# Patient Record
Sex: Female | Born: 1945 | Race: White | Hispanic: No | Marital: Married | Smoking: Never smoker
Health system: Southern US, Community
[De-identification: ages and names within clinical notes are randomized; demographics above are authoritative.]

## PROBLEM LIST (undated history)

## (undated) DIAGNOSIS — E785 Hyperlipidemia, unspecified: Secondary | ICD-10-CM

## (undated) DIAGNOSIS — Z972 Presence of dental prosthetic device (complete) (partial): Secondary | ICD-10-CM

## (undated) DIAGNOSIS — K219 Gastro-esophageal reflux disease without esophagitis: Secondary | ICD-10-CM

## (undated) DIAGNOSIS — G4730 Sleep apnea, unspecified: Secondary | ICD-10-CM

## (undated) DIAGNOSIS — H3530 Unspecified macular degeneration: Secondary | ICD-10-CM

## (undated) DIAGNOSIS — J302 Other seasonal allergic rhinitis: Secondary | ICD-10-CM

## (undated) DIAGNOSIS — M810 Age-related osteoporosis without current pathological fracture: Secondary | ICD-10-CM

## (undated) DIAGNOSIS — M62838 Other muscle spasm: Secondary | ICD-10-CM

## (undated) DIAGNOSIS — I10 Essential (primary) hypertension: Secondary | ICD-10-CM

## (undated) DIAGNOSIS — E119 Type 2 diabetes mellitus without complications: Secondary | ICD-10-CM

## (undated) HISTORY — PX: UPPER GASTROINTESTINAL ENDOSCOPY: SHX188

## (undated) HISTORY — PX: COLONOSCOPY: SHX5424

## (undated) HISTORY — PX: BREAST CYST ASPIRATION: SHX578

---

## 2004-12-24 ENCOUNTER — Ambulatory Visit: Payer: Self-pay | Admitting: Internal Medicine

## 2005-12-28 ENCOUNTER — Ambulatory Visit: Payer: Self-pay | Admitting: Internal Medicine

## 2007-01-10 ENCOUNTER — Ambulatory Visit: Payer: Self-pay | Admitting: Internal Medicine

## 2007-11-13 ENCOUNTER — Ambulatory Visit: Payer: Self-pay | Admitting: Internal Medicine

## 2008-02-07 ENCOUNTER — Ambulatory Visit: Payer: Self-pay | Admitting: Internal Medicine

## 2008-04-09 ENCOUNTER — Ambulatory Visit: Payer: Self-pay | Admitting: Internal Medicine

## 2008-11-13 IMAGING — US ABDOMEN ULTRASOUND
1 series · 17 of 25 positions shown · non-contrast
Comparison: none

REASON FOR EXAM: abdominal pain and diabetic
COMMENTS:

[Series 1: abdomen ultrasound · 17 of 58 slices shown]
[im 1/58]
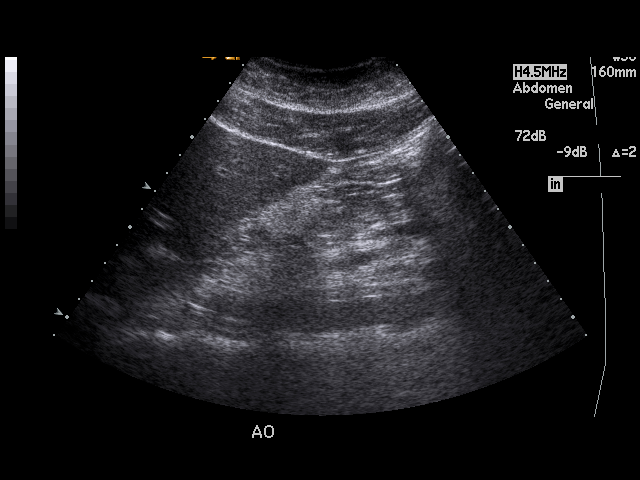
[im 5/58]
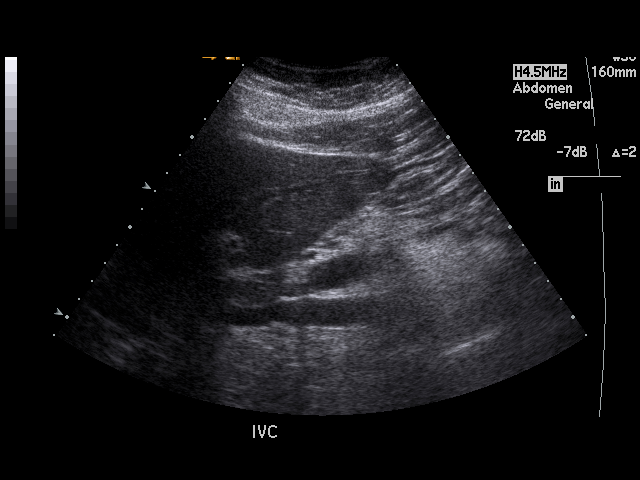
[im 8/58]
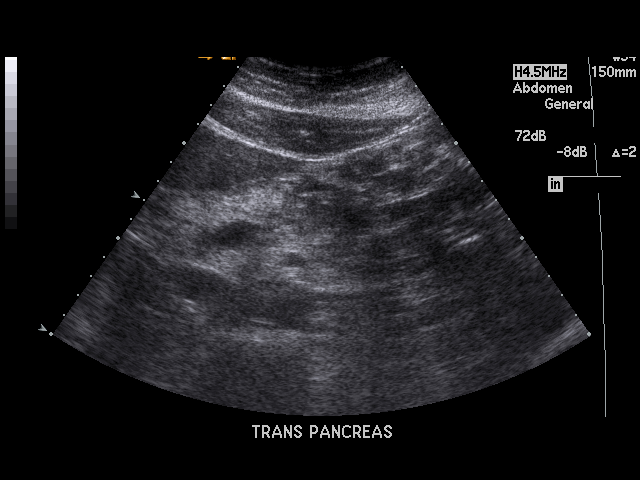
[im 12/58]
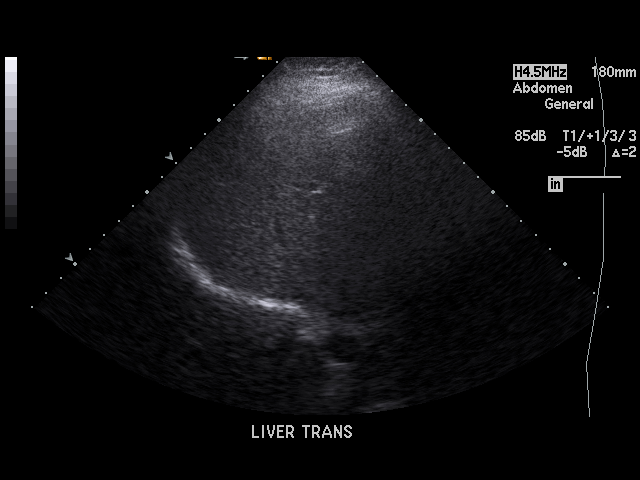
[im 15/58]
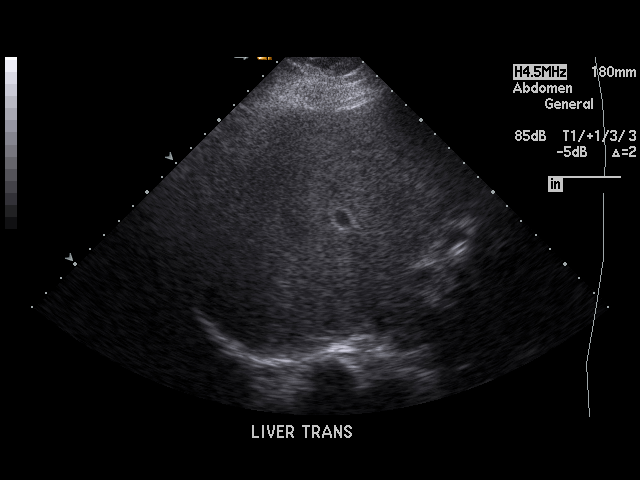
[im 20/58]
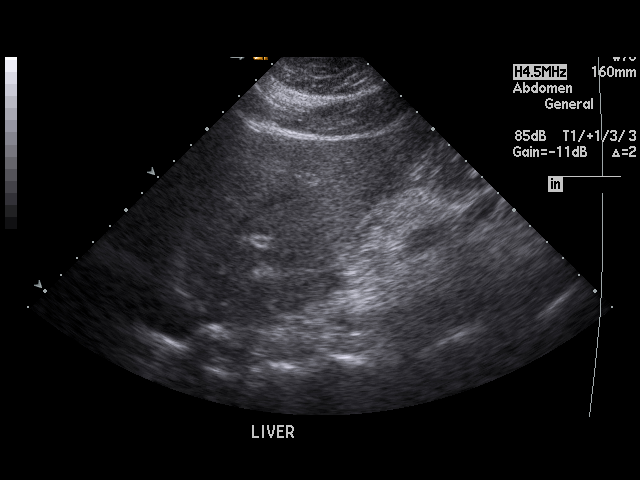
[im 22/58]
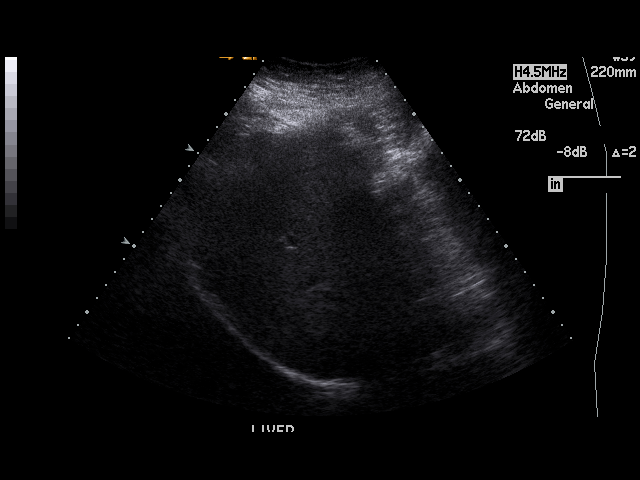
[im 27/58]
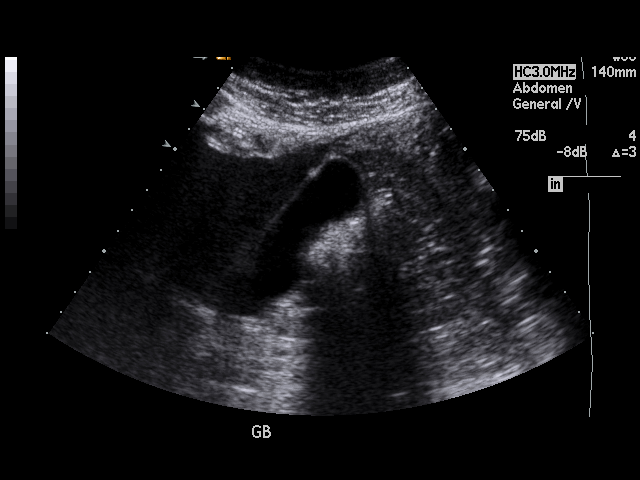
[im 29/58]
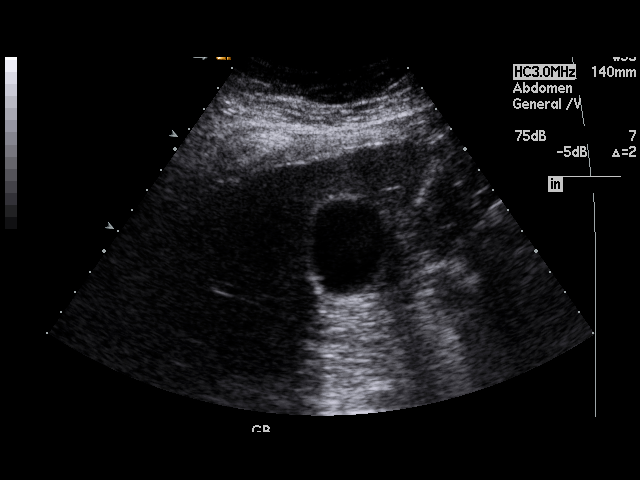
[im 31/58]
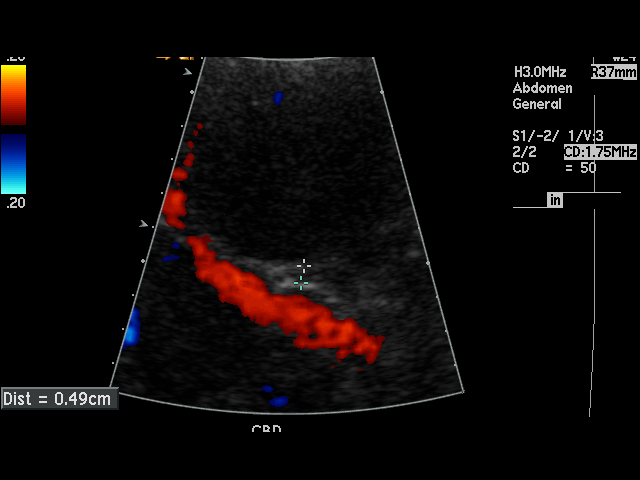
[im 36/58]
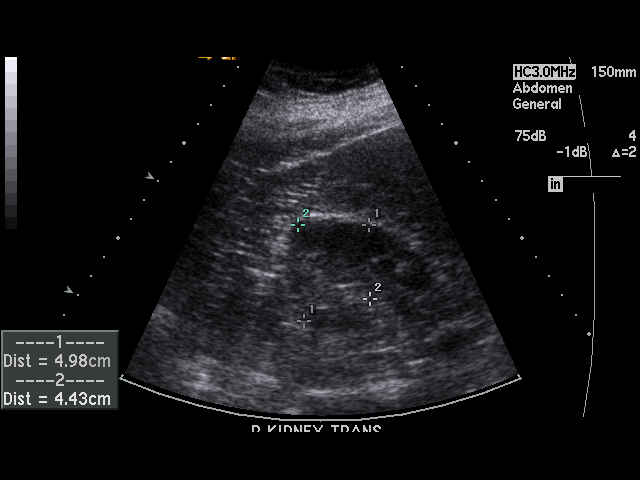
[im 39/58]
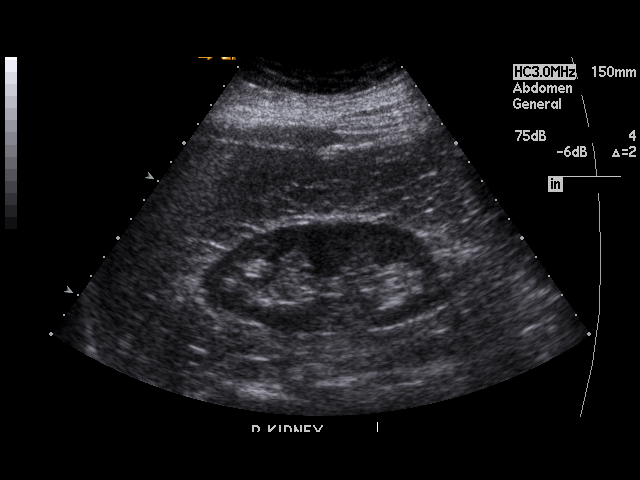
[im 43/58]
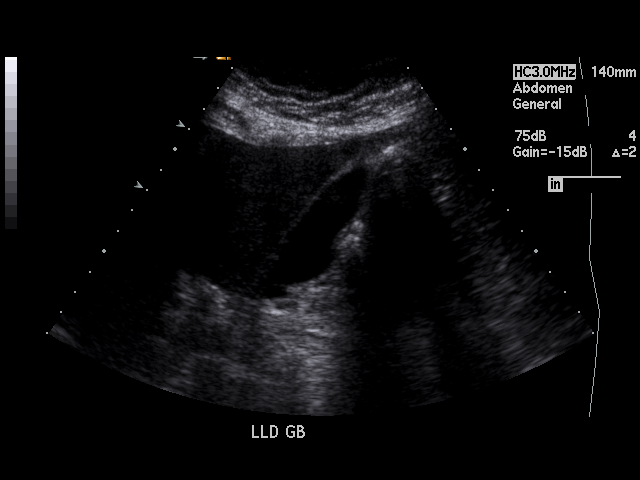
[im 46/58]
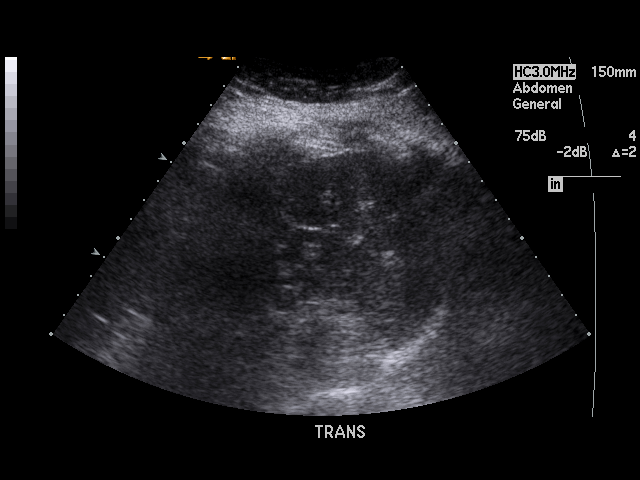
[im 50/58]
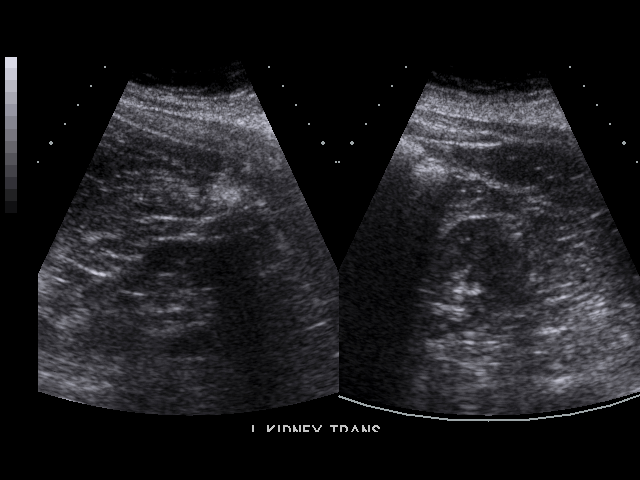
[im 53/58]
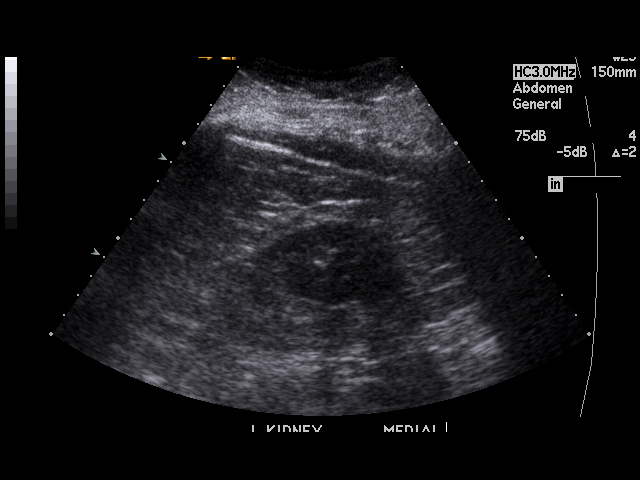
[im 58/58]
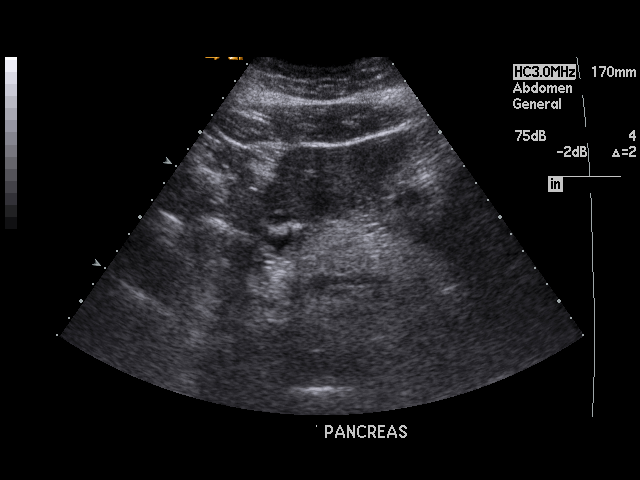

[17 of 25 positions shown; findings below may reference images not displayed]

PROCEDURE:     US  - US ABDOMEN GENERAL SURVEY  - April 09, 2008  [DATE]

RESULT:     The liver is dense, suspicious for fatty infiltration. The
spleen, pancreas, abdominal aorta and inferior vena cava show no significant
abnormalities. No gallstones are seen. There is no thickening of the
gallbladder wall. The common bile duct measures 4.9 mm in diameter which is
within normal limits. The kidneys show no hydronephrosis. There is no
ascites.
IMPRESSION: 1. Probable fatty infiltration of the liver.
2. No gallstones are seen.
3. The pancreas is normal in appearance.
4. There is no ascites.

## 2009-02-11 ENCOUNTER — Ambulatory Visit: Payer: Self-pay | Admitting: Internal Medicine

## 2009-02-19 ENCOUNTER — Ambulatory Visit: Payer: Self-pay | Admitting: Internal Medicine

## 2009-07-29 ENCOUNTER — Ambulatory Visit: Payer: Self-pay | Admitting: Internal Medicine

## 2010-02-12 ENCOUNTER — Ambulatory Visit: Payer: Self-pay | Admitting: Internal Medicine

## 2011-02-25 ENCOUNTER — Ambulatory Visit: Payer: Self-pay | Admitting: Internal Medicine

## 2012-03-20 ENCOUNTER — Ambulatory Visit: Payer: Self-pay | Admitting: Internal Medicine

## 2012-04-03 ENCOUNTER — Ambulatory Visit: Payer: Self-pay | Admitting: Gastroenterology

## 2013-03-21 ENCOUNTER — Ambulatory Visit: Payer: Self-pay

## 2013-03-26 ENCOUNTER — Ambulatory Visit: Payer: Self-pay

## 2014-03-22 ENCOUNTER — Ambulatory Visit: Payer: Self-pay | Admitting: Internal Medicine

## 2014-04-03 ENCOUNTER — Ambulatory Visit: Payer: Self-pay | Admitting: Internal Medicine

## 2015-04-24 ENCOUNTER — Ambulatory Visit
Admit: 2015-04-24 | Disposition: A | Discharge status: Home / Self Care | Payer: Self-pay | Attending: Infectious Diseases | Admitting: Infectious Diseases

## 2016-04-19 ENCOUNTER — Other Ambulatory Visit: Payer: Self-pay | Admitting: Internal Medicine

## 2016-04-19 DIAGNOSIS — Z1231 Encounter for screening mammogram for malignant neoplasm of breast: Secondary | ICD-10-CM

## 2016-04-29 ENCOUNTER — Ambulatory Visit
Admission: RE | Admit: 2016-04-29 | Discharge: 2016-04-29 | Disposition: A | Discharge status: Home / Self Care | Payer: Medicare Other | Source: Ambulatory Visit | Attending: Internal Medicine | Admitting: Internal Medicine

## 2016-04-29 ENCOUNTER — Other Ambulatory Visit: Payer: Self-pay | Admitting: Internal Medicine

## 2016-04-29 DIAGNOSIS — Z1231 Encounter for screening mammogram for malignant neoplasm of breast: Secondary | ICD-10-CM

## 2017-04-25 ENCOUNTER — Other Ambulatory Visit: Payer: Self-pay | Admitting: Internal Medicine

## 2017-04-25 DIAGNOSIS — Z1239 Encounter for other screening for malignant neoplasm of breast: Secondary | ICD-10-CM

## 2017-05-18 ENCOUNTER — Ambulatory Visit
Admission: RE | Admit: 2017-05-18 | Discharge: 2017-05-18 | Disposition: A | Discharge status: Home / Self Care | Payer: Medicare Other | Source: Ambulatory Visit | Attending: Internal Medicine | Admitting: Internal Medicine

## 2017-05-18 DIAGNOSIS — Z1231 Encounter for screening mammogram for malignant neoplasm of breast: Secondary | ICD-10-CM | POA: Insufficient documentation

## 2017-05-18 DIAGNOSIS — Z1239 Encounter for other screening for malignant neoplasm of breast: Secondary | ICD-10-CM

## 2017-06-08 ENCOUNTER — Other Ambulatory Visit: Payer: Self-pay | Admitting: Gastroenterology

## 2017-06-08 DIAGNOSIS — R1310 Dysphagia, unspecified: Secondary | ICD-10-CM

## 2017-06-15 ENCOUNTER — Ambulatory Visit
Admission: RE | Admit: 2017-06-15 | Discharge: 2017-06-15 | Disposition: A | Discharge status: Home / Self Care | Payer: Medicare Other | Source: Ambulatory Visit | Attending: Gastroenterology | Admitting: Gastroenterology

## 2017-06-15 DIAGNOSIS — R1310 Dysphagia, unspecified: Secondary | ICD-10-CM

## 2017-06-15 DIAGNOSIS — K449 Diaphragmatic hernia without obstruction or gangrene: Secondary | ICD-10-CM | POA: Insufficient documentation

## 2017-10-25 ENCOUNTER — Encounter: Payer: Self-pay | Admitting: Anesthesiology

## 2017-10-25 ENCOUNTER — Ambulatory Visit: Payer: Medicare Other | Admitting: Anesthesiology

## 2017-10-25 ENCOUNTER — Encounter
Admission: RE | Disposition: A | Discharge status: Home / Self Care | Payer: Self-pay | Source: Ambulatory Visit | Attending: Gastroenterology

## 2017-10-25 ENCOUNTER — Ambulatory Visit
Admission: RE | Admit: 2017-10-25 | Discharge: 2017-10-25 | Disposition: A | Discharge status: Home / Self Care | Payer: Medicare Other | Source: Ambulatory Visit | Attending: Gastroenterology | Admitting: Gastroenterology

## 2017-10-25 DIAGNOSIS — K449 Diaphragmatic hernia without obstruction or gangrene: Secondary | ICD-10-CM | POA: Insufficient documentation

## 2017-10-25 DIAGNOSIS — K210 Gastro-esophageal reflux disease with esophagitis: Secondary | ICD-10-CM | POA: Diagnosis not present

## 2017-10-25 DIAGNOSIS — K5730 Diverticulosis of large intestine without perforation or abscess without bleeding: Secondary | ICD-10-CM | POA: Insufficient documentation

## 2017-10-25 DIAGNOSIS — K644 Residual hemorrhoidal skin tags: Secondary | ICD-10-CM | POA: Insufficient documentation

## 2017-10-25 DIAGNOSIS — K317 Polyp of stomach and duodenum: Secondary | ICD-10-CM | POA: Insufficient documentation

## 2017-10-25 DIAGNOSIS — Z86010 Personal history of colonic polyps: Secondary | ICD-10-CM | POA: Insufficient documentation

## 2017-10-25 DIAGNOSIS — K3189 Other diseases of stomach and duodenum: Secondary | ICD-10-CM | POA: Insufficient documentation

## 2017-10-25 DIAGNOSIS — E785 Hyperlipidemia, unspecified: Secondary | ICD-10-CM | POA: Insufficient documentation

## 2017-10-25 DIAGNOSIS — Q438 Other specified congenital malformations of intestine: Secondary | ICD-10-CM | POA: Insufficient documentation

## 2017-10-25 DIAGNOSIS — R1310 Dysphagia, unspecified: Secondary | ICD-10-CM | POA: Diagnosis not present

## 2017-10-25 DIAGNOSIS — K640 First degree hemorrhoids: Secondary | ICD-10-CM | POA: Insufficient documentation

## 2017-10-25 DIAGNOSIS — Z794 Long term (current) use of insulin: Secondary | ICD-10-CM | POA: Insufficient documentation

## 2017-10-25 DIAGNOSIS — K2970 Gastritis, unspecified, without bleeding: Secondary | ICD-10-CM | POA: Insufficient documentation

## 2017-10-25 DIAGNOSIS — Z1211 Encounter for screening for malignant neoplasm of colon: Secondary | ICD-10-CM | POA: Insufficient documentation

## 2017-10-25 DIAGNOSIS — Z79899 Other long term (current) drug therapy: Secondary | ICD-10-CM | POA: Insufficient documentation

## 2017-10-25 DIAGNOSIS — I10 Essential (primary) hypertension: Secondary | ICD-10-CM | POA: Diagnosis not present

## 2017-10-25 DIAGNOSIS — J302 Other seasonal allergic rhinitis: Secondary | ICD-10-CM | POA: Diagnosis not present

## 2017-10-25 DIAGNOSIS — E119 Type 2 diabetes mellitus without complications: Secondary | ICD-10-CM | POA: Insufficient documentation

## 2017-10-25 DIAGNOSIS — D123 Benign neoplasm of transverse colon: Secondary | ICD-10-CM | POA: Diagnosis not present

## 2017-10-25 HISTORY — PX: ESOPHAGOGASTRODUODENOSCOPY (EGD) WITH PROPOFOL: SHX5813

## 2017-10-25 HISTORY — DX: Hyperlipidemia, unspecified: E78.5

## 2017-10-25 HISTORY — PX: COLONOSCOPY WITH PROPOFOL: SHX5780

## 2017-10-25 HISTORY — DX: Type 2 diabetes mellitus without complications: E11.9

## 2017-10-25 HISTORY — DX: Unspecified macular degeneration: H35.30

## 2017-10-25 HISTORY — DX: Gastro-esophageal reflux disease without esophagitis: K21.9

## 2017-10-25 HISTORY — DX: Essential (primary) hypertension: I10

## 2017-10-25 HISTORY — DX: Age-related osteoporosis without current pathological fracture: M81.0

## 2017-10-25 HISTORY — DX: Other seasonal allergic rhinitis: J30.2

## 2017-10-25 HISTORY — DX: Other muscle spasm: M62.838

## 2017-10-25 LAB — GLUCOSE, CAPILLARY: GLUCOSE-CAPILLARY: 122 mg/dL — AB (ref 65–99)

## 2017-10-25 SURGERY — ESOPHAGOGASTRODUODENOSCOPY (EGD) WITH PROPOFOL
Anesthesia: General

## 2017-10-25 MED ORDER — FENTANYL CITRATE (PF) 100 MCG/2ML IJ SOLN
INTRAMUSCULAR | Status: DC | PRN
  Administered 2017-10-25: 25 ug via INTRAVENOUS
  Administered 2017-10-25: 50 ug via INTRAVENOUS
  Administered 2017-10-25: 25 ug via INTRAVENOUS

## 2017-10-25 MED ORDER — BUTAMBEN-TETRACAINE-BENZOCAINE 2-2-14 % EX AERO
INHALATION_SPRAY | CUTANEOUS | Status: AC
  Filled 2017-10-25: qty 5

## 2017-10-25 MED ORDER — SODIUM CHLORIDE 0.9 % IV SOLN
INTRAVENOUS | Status: DC
  Administered 2017-10-25: 1000 mL via INTRAVENOUS

## 2017-10-25 MED ORDER — SODIUM CHLORIDE 0.9 % IV SOLN: INTRAVENOUS | Status: DC

## 2017-10-25 MED ORDER — MIDAZOLAM HCL 2 MG/2ML IJ SOLN
INTRAMUSCULAR | Status: AC
  Filled 2017-10-25: qty 2

## 2017-10-25 MED ORDER — MIDAZOLAM HCL 2 MG/2ML IJ SOLN
INTRAMUSCULAR | Status: DC | PRN
  Administered 2017-10-25: 2 mg via INTRAVENOUS

## 2017-10-25 MED ORDER — LIDOCAINE HCL (PF) 2 % IJ SOLN
INTRAMUSCULAR | Status: AC
  Filled 2017-10-25: qty 10

## 2017-10-25 MED ORDER — PROPOFOL 10 MG/ML IV BOLUS
INTRAVENOUS | Status: AC
  Filled 2017-10-25: qty 20

## 2017-10-25 MED ORDER — LIDOCAINE HCL (CARDIAC) 20 MG/ML IV SOLN
INTRAVENOUS | Status: DC | PRN
  Administered 2017-10-25: 30 mg via INTRAVENOUS

## 2017-10-25 MED ORDER — PROPOFOL 500 MG/50ML IV EMUL
INTRAVENOUS | Status: AC
  Filled 2017-10-25: qty 50

## 2017-10-25 MED ORDER — PHENYLEPHRINE HCL 10 MG/ML IJ SOLN
INTRAMUSCULAR | Status: DC | PRN
  Administered 2017-10-25 (×3): 50 ug via INTRAVENOUS

## 2017-10-25 MED ORDER — FENTANYL CITRATE (PF) 100 MCG/2ML IJ SOLN
INTRAMUSCULAR | Status: AC
  Filled 2017-10-25: qty 2

## 2017-10-25 MED ORDER — PROPOFOL 500 MG/50ML IV EMUL
INTRAVENOUS | Status: DC | PRN
  Administered 2017-10-25: 120 ug/kg/min via INTRAVENOUS

## 2017-10-25 NOTE — H&P (Signed)
Outpatient short stay form Pre-procedure 10/25/2017 7:49 AM Lollie Sails MD  Primary Physician: Dr Tracie Harrier  Reason for visit:  EGD and colonoscopy  History of present illness:  Patient is a 71 year old female presenting today as above. She has personal history of colon polyps. She is also developed some dysphagia symptoms in the lower chest region since her last procedure in 2013. She is not regurgitating foods although it feels like things are slowly going down and requires a little bit of help with water. She did have a barium swallow that showed no delay for a standardized tablet. sHe does have a small hiatal hernia. She denies use of any aspirin for the last week and does not take any blood thinning agent otherwise. She tolerated her prep well. She does take a proton pump inhibitor, omeprazole 40 mg.    Current Facility-Administered Medications:  .  0.9 %  sodium chloride infusion, , Intravenous, Continuous, Lollie Sails, MD, Last Rate: 20 mL/hr at 10/25/17 0746, 1,000 mL at 10/25/17 0746 .  0.9 %  sodium chloride infusion, , Intravenous, Continuous, Lollie Sails, MD  Prescriptions Prior to Admission  Medication Sig Dispense Refill Last Dose  . atorvastatin (LIPITOR) 40 MG tablet Take 40 mg by mouth daily.   10/24/2017 at Unknown time  . Butenafine HCl 1 % cream Apply topically daily.   Past Week at Unknown time  . Calcium Carbonate-Vitamin D (CALTRATE 600+D PO) Take by mouth daily.   Past Week at Unknown time  . cetirizine (ZYRTEC) 10 MG tablet Take 10 mg by mouth daily.   10/24/2017 at Unknown time  . Cholecalciferol 2000 units CAPS Take by mouth daily.   Past Week at Unknown time  . cyanocobalamin 2000 MCG tablet Take 2,000 mcg by mouth daily.   Past Week at Unknown time  . Dulaglutide (TRULICITY) 1.5 JY/7.8GN SOPN Inject into the skin every 7 (seven) days.   10/24/2017 at Unknown time  . fexofenadine (ALLEGRA) 180 MG tablet Take 180 mg by mouth daily.   Past  Week at Unknown time  . Insulin Glargine (LANTUS SOLOSTAR [place]) Inject 16 Units into the skin daily.   10/24/2017 at Unknown time  . lisinopril (PRINIVIL,ZESTRIL) 10 MG tablet Take 10 mg by mouth daily.   10/25/2017 at 0545  . metFORMIN (GLUCOPHAGE) 1000 MG tablet Take 1,000 mg by mouth 2 (two) times daily with a meal.   Past Week at Unknown time  . omeprazole (PRILOSEC) 40 MG capsule Take 40 mg by mouth daily.   10/24/2017 at Unknown time     Allergies  Allergen Reactions  . Formaldehyde   . Latex   . Macrodantin [Nitrofurantoin]      Past Medical History:  Diagnosis Date  . Diabetes mellitus without complication ([place])   . GERD (gastroesophageal reflux disease)   . Hyperlipidemia   . Hypertension   . Macular degeneration   . Night muscle spasms   . Osteoporosis   . Seasonal allergies     Review of systems:      Physical Exam    Heart and lungs: Regular rate and rhythm without rub or gallop, lungs are bilaterally clear.    HEENT: Normocephalic atraumatic eyes are anicteric    Other:     Pertinant exam for procedure: Soft nontender nondistended bowel sounds positive normoactive.    Planned proceedures: EGD and Colonoscopy with indicated procedures. I have discussed the risks benefits and complications of procedures to include not limited to bleeding,  infection, perforation and the risk of sedation and the patient wishes to proceed.    Lollie Sails, MD Gastroenterology 10/25/2017  7:49 AM

## 2017-10-25 NOTE — Op Note (Signed)
[facility] Gastroenterology Patient Name: Tara Gilbert Procedure Date: 10/25/2017 7:43 AM MRN: [id number] Account #: [id number] Date of Birth: [birth year] Admit Type: Outpatient Age: 71 Room: [facility] ENDO ROOM 3 Gender: Female Note Status: Finalized Procedure:            Colonoscopy Indications:          Personal history of colonic polyps Providers:            Lollie Sails, MD Referring MD:         Tracie Harrier, MD (Referring MD) Medicines:            Monitored Anesthesia Care Complications:        No immediate complications. Procedure:            Pre-Anesthesia Assessment:                       - ASA Grade Assessment: III - A patient with severe                        systemic disease.                       After obtaining informed consent, the colonoscope was                        passed under direct vision. Throughout the procedure,                        the patient's blood pressure, pulse, and oxygen                        saturations were monitored continuously. The                        Colonoscope was introduced through the anus and                        advanced to the the cecum, identified by appendiceal                        orifice and ileocecal valve. The colonoscopy was                        unusually difficult due to significant looping and a                        tortuous colon. Successful completion of the procedure                        was aided by changing the patient to a supine position,                        changing the patient to a prone position, using manual                        pressure and withdrawing and reinserting the scope. The                        patient tolerated the procedure well. The quality of  the bowel preparation was good. Findings:      A 1 mm polyp was found in the transverse colon. The polyp was sessile.       The polyp was removed with a cold biopsy forceps. Resection and     retrieval were complete.      A few small-mouthed diverticula were found in the sigmoid colon and       distal descending colon.      The colon (entire examined portion) was significantly redundant.      Non-bleeding external and internal hemorrhoids were found during       retroflexion and during anoscopy. The hemorrhoids were small and Grade I       (internal hemorrhoids that do not prolapse).      No additional abnormalities were found on retroflexion.      The digital rectal exam was normal otherwise.      Biopsies for histology were taken with a cold forceps from the left       colon for evaluation of microscopic colitis. Impression:           - One 1 mm polyp in the transverse colon, removed with                        a cold biopsy forceps. Resected and retrieved.                       - Diverticulosis in the sigmoid colon and in the distal                        descending colon.                       - Redundant colon.                       - Non-bleeding external and internal hemorrhoids. Recommendation:       - Discharge patient to home.                       - Telephone GI clinic for pathology results in 1 week. Procedure Code(s):    --- Professional ---                       [phone], Colonoscopy, flexible; with biopsy, single or                        multiple Diagnosis Code(s):    --- Professional ---                       D12.3, Benign neoplasm of transverse colon (hepatic                        flexure or splenic flexure)                       K64.0, First degree hemorrhoids                       Z86.010, Personal history of colonic polyps                       K57.30, Diverticulosis of large intestine without  perforation or abscess without bleeding                       Q43.8, Other specified congenital malformations of                        intestine CPT copyright 2016 American Medical Association. All rights reserved. The codes documented in  this report are preliminary and upon coder review may  be revised to meet current compliance requirements. Lollie Sails, MD 10/25/2017 9:06:33 AM This report has been signed electronically. Number of Addenda: 0 Note Initiated On: 10/25/2017 7:43 AM Scope Withdrawal Time: 0 hours 8 minutes 31 seconds  Total Procedure Duration: 0 hours 33 minutes 26 seconds       [facility]

## 2017-10-25 NOTE — Transfer of Care (Signed)
Immediate Anesthesia Transfer of Care Note  Patient: Tara Gilbert  Procedure(s) Performed: ESOPHAGOGASTRODUODENOSCOPY (EGD) WITH PROPOFOL (N/A ) COLONOSCOPY WITH PROPOFOL (N/A )  Patient Location: PACU  Anesthesia Type:General  Level of Consciousness: awake and sedated  Airway & Oxygen Therapy: Patient Spontanous Breathing and Patient connected to nasal cannula oxygen  Post-op Assessment: Report given to RN and Post -op Vital signs reviewed and stable  Post vital signs: Reviewed and stable  Last Vitals:  Vitals:   10/25/17 0736  BP: 127/68  Pulse: 80  Resp: 16  Temp: (!) 35.6 C  SpO2: 100%    Last Pain:  Vitals:   10/25/17 0736  TempSrc: Tympanic         Complications: No apparent anesthesia complications

## 2017-10-25 NOTE — Op Note (Addendum)
[facility] Gastroenterology Patient Name: Tara Gilbert Procedure Date: 10/25/2017 7:44 AM MRN: [id number] Account #: [id number] Date of Birth: 09/23/1946 Admit Type: Outpatient Age: 71 Room: [facility] ENDO ROOM 3 Gender: Female Note Status: Finalized Procedure:            Upper GI endoscopy Indications:          Dysphagia Providers:            Lollie Sails, MD Referring MD:         Tracie Harrier, MD (Referring MD) Medicines:            Monitored Anesthesia Care Complications:        No immediate complications. Procedure:            Pre-Anesthesia Assessment:                       - ASA Grade Assessment: III - A patient with severe                        systemic disease.                       After obtaining informed consent, the endoscope was                        passed under direct vision. Throughout the procedure,                        the patient's blood pressure, pulse, and oxygen                        saturations were monitored continuously. The Endoscope                        was introduced through the mouth, and advanced to the                        third part of duodenum. The patient tolerated the                        procedure well. Findings:      A small hiatal hernia was present.      LA Grade C (one or more mucosal breaks continuous between tops of 2 or       more mucosal folds, less than 75% circumference) esophagitis with no       bleeding was found. Biopsies were taken with a cold forceps for       histology. There is no narrowing or evidence of a ring noted.      Diffuse and patchy minimal inflammation characterized by erythema was       found in the gastric body. Biopsies were taken with a cold forceps for       histology.      Multiple 1 to 5 mm pedunculated and sessile polyps with no bleeding and       no stigmata of recent bleeding were found in the gastric body. Biopsies       were taken with a cold forceps for histology.   The cardia and gastric fundus were normal on retroflexion otherwise.      The examined duodenum was normal. Impression:           -  Small hiatal hernia.                       - LA Grade C reflux esophagitis. Biopsied.                       - Gastritis. Biopsied.                       - Multiple gastric polyps. Biopsied.                       - Normal examined duodenum. Recommendation:       - Use Protonix (pantoprazole) 40 mg PO daily.                       - Return to GI clinic in 6 weeks.                       - Await pathology results. Procedure Code(s):    --- Professional ---                       [phone], Esophagogastroduodenoscopy, flexible, transoral;                        with biopsy, single or multiple Diagnosis Code(s):    --- Professional ---                       K44.9, Diaphragmatic hernia without obstruction or                        gangrene                       K21.0, Gastro-esophageal reflux disease with esophagitis                       K29.70, Gastritis, unspecified, without bleeding                       K31.7, Polyp of stomach and duodenum                       R13.10, Dysphagia, unspecified CPT copyright 2016 American Medical Association. All rights reserved. The codes documented in this report are preliminary and upon coder review may  be revised to meet current compliance requirements. Lollie Sails, MD 10/25/2017 8:23:38 AM This report has been signed electronically. Number of Addenda: 0 Note Initiated On: 10/25/2017 7:44 AM      [facility]

## 2017-10-25 NOTE — Anesthesia Procedure Notes (Signed)
Performed by: COOK-MARTIN, Joelie Schou Pre-anesthesia Checklist: Patient identified, Emergency Drugs available, Suction available, Patient being monitored and Timeout performed Patient Re-evaluated:Patient Re-evaluated prior to induction Oxygen Delivery Method: Nasal cannula Preoxygenation: Pre-oxygenation with 100% oxygen Induction Type: IV induction Airway Equipment and Method: Bite block Placement Confirmation: positive ETCO2 and CO2 detector       

## 2017-10-25 NOTE — Anesthesia Postprocedure Evaluation (Signed)
Anesthesia Post Note  Patient: Tara Gilbert  Procedure(s) Performed: ESOPHAGOGASTRODUODENOSCOPY (EGD) WITH PROPOFOL (N/A ) COLONOSCOPY WITH PROPOFOL (N/A )  Patient location during evaluation: Endoscopy Anesthesia Type: General Level of consciousness: awake and alert Pain management: pain level controlled Vital Signs Assessment: post-procedure vital signs reviewed and stable Respiratory status: spontaneous breathing and respiratory function stable Cardiovascular status: stable Anesthetic complications: no     Last Vitals:  Vitals:   10/25/17 0927 10/25/17 0937  BP: (!) 96/50 (!) 91/51  Pulse: 75 83  Resp: (!) 23 16  Temp:    SpO2: 100% 99%    Last Pain:  Vitals:   10/25/17 0907  TempSrc: Tympanic                 KEPHART,WILLIAM K

## 2017-10-25 NOTE — Anesthesia Preprocedure Evaluation (Signed)
Anesthesia Evaluation  Patient identified by MRN, date of birth, ID band Patient awake    Reviewed: Allergy & Precautions, NPO status , Patient's Chart, lab work & pertinent test results  History of Anesthesia Complications Negative for: history of anesthetic complications  Airway Mallampati: II       Dental   Pulmonary neg sleep apnea, neg COPD,           Cardiovascular hypertension, Pt. on medications (-) Past MI and (-) CHF (-) dysrhythmias + Valvular Problems/Murmurs (murmur, no tx)      Neuro/Psych neg Seizures    GI/Hepatic Neg liver ROS, GERD  Medicated and Controlled,  Endo/Other  diabetes, Type 2, Oral Hypoglycemic Agents  Renal/GU negative Renal ROS     Musculoskeletal   Abdominal   Peds  Hematology   Anesthesia Other Findings   Reproductive/Obstetrics                             Anesthesia Physical Anesthesia Plan  ASA: III  Anesthesia Plan: General   Post-op Pain Management:    Induction: Intravenous  PONV Risk Score and Plan: Propofol infusion  Airway Management Planned: Nasal Cannula  Additional Equipment:   Intra-op Plan:   Post-operative Plan:   Informed Consent: I have reviewed the patients History and Physical, chart, labs and discussed the procedure including the risks, benefits and alternatives for the proposed anesthesia with the patient or authorized representative who has indicated his/her understanding and acceptance.     Plan Discussed with:   Anesthesia Plan Comments:         Anesthesia Quick Evaluation

## 2017-10-25 NOTE — Anesthesia Post-op Follow-up Note (Signed)
Anesthesia QCDR form completed.        

## 2017-10-26 ENCOUNTER — Encounter: Payer: Self-pay | Admitting: Gastroenterology

## 2017-10-26 LAB — SURGICAL PATHOLOGY

## 2018-04-18 ENCOUNTER — Other Ambulatory Visit: Payer: Self-pay | Admitting: Internal Medicine

## 2018-04-18 DIAGNOSIS — Z1231 Encounter for screening mammogram for malignant neoplasm of breast: Secondary | ICD-10-CM

## 2018-05-24 ENCOUNTER — Ambulatory Visit
Admission: RE | Admit: 2018-05-24 | Discharge: 2018-05-24 | Disposition: A | Discharge status: Home / Self Care | Payer: Medicare Other | Source: Ambulatory Visit | Attending: Internal Medicine | Admitting: Internal Medicine

## 2018-05-24 DIAGNOSIS — Z1231 Encounter for screening mammogram for malignant neoplasm of breast: Secondary | ICD-10-CM | POA: Insufficient documentation

## 2019-04-27 ENCOUNTER — Other Ambulatory Visit: Payer: Self-pay | Admitting: Internal Medicine

## 2019-04-27 DIAGNOSIS — Z1231 Encounter for screening mammogram for malignant neoplasm of breast: Secondary | ICD-10-CM

## 2019-07-25 ENCOUNTER — Ambulatory Visit
Admission: RE | Admit: 2019-07-25 | Discharge: 2019-07-25 | Disposition: A | Discharge status: Home / Self Care | Payer: Medicare Other | Source: Ambulatory Visit | Attending: Internal Medicine | Admitting: Internal Medicine

## 2019-07-25 ENCOUNTER — Other Ambulatory Visit: Payer: Self-pay

## 2019-07-25 DIAGNOSIS — Z1231 Encounter for screening mammogram for malignant neoplasm of breast: Secondary | ICD-10-CM | POA: Diagnosis not present

## 2019-07-30 ENCOUNTER — Other Ambulatory Visit: Payer: Self-pay | Admitting: Internal Medicine

## 2019-07-30 DIAGNOSIS — R928 Other abnormal and inconclusive findings on diagnostic imaging of breast: Secondary | ICD-10-CM

## 2019-07-30 DIAGNOSIS — N6320 Unspecified lump in the left breast, unspecified quadrant: Secondary | ICD-10-CM

## 2019-08-13 ENCOUNTER — Other Ambulatory Visit: Payer: Self-pay

## 2019-08-13 ENCOUNTER — Ambulatory Visit
Admission: RE | Admit: 2019-08-13 | Discharge: 2019-08-13 | Disposition: A | Discharge status: Home / Self Care | Payer: Medicare Other | Source: Ambulatory Visit | Attending: Internal Medicine | Admitting: Internal Medicine

## 2019-08-13 DIAGNOSIS — N6320 Unspecified lump in the left breast, unspecified quadrant: Secondary | ICD-10-CM | POA: Insufficient documentation

## 2019-08-13 DIAGNOSIS — R928 Other abnormal and inconclusive findings on diagnostic imaging of breast: Secondary | ICD-10-CM | POA: Diagnosis not present

## 2020-03-18 IMAGING — MG DIGITAL DIAGNOSTIC UNILATERAL LEFT MAMMOGRAM WITH TOMO AND CAD
4 series · 4 of 12 positions shown · non-contrast
Comparison: Previous exam(s).

CLINICAL DATA: Recall from screening to evaluate a left breast
mass.

EXAM:
DIGITAL DIAGNOSTIC left MAMMOGRAM WITH TOMO
ULTRASOUND left BREAST

[L MLO synth-2D]
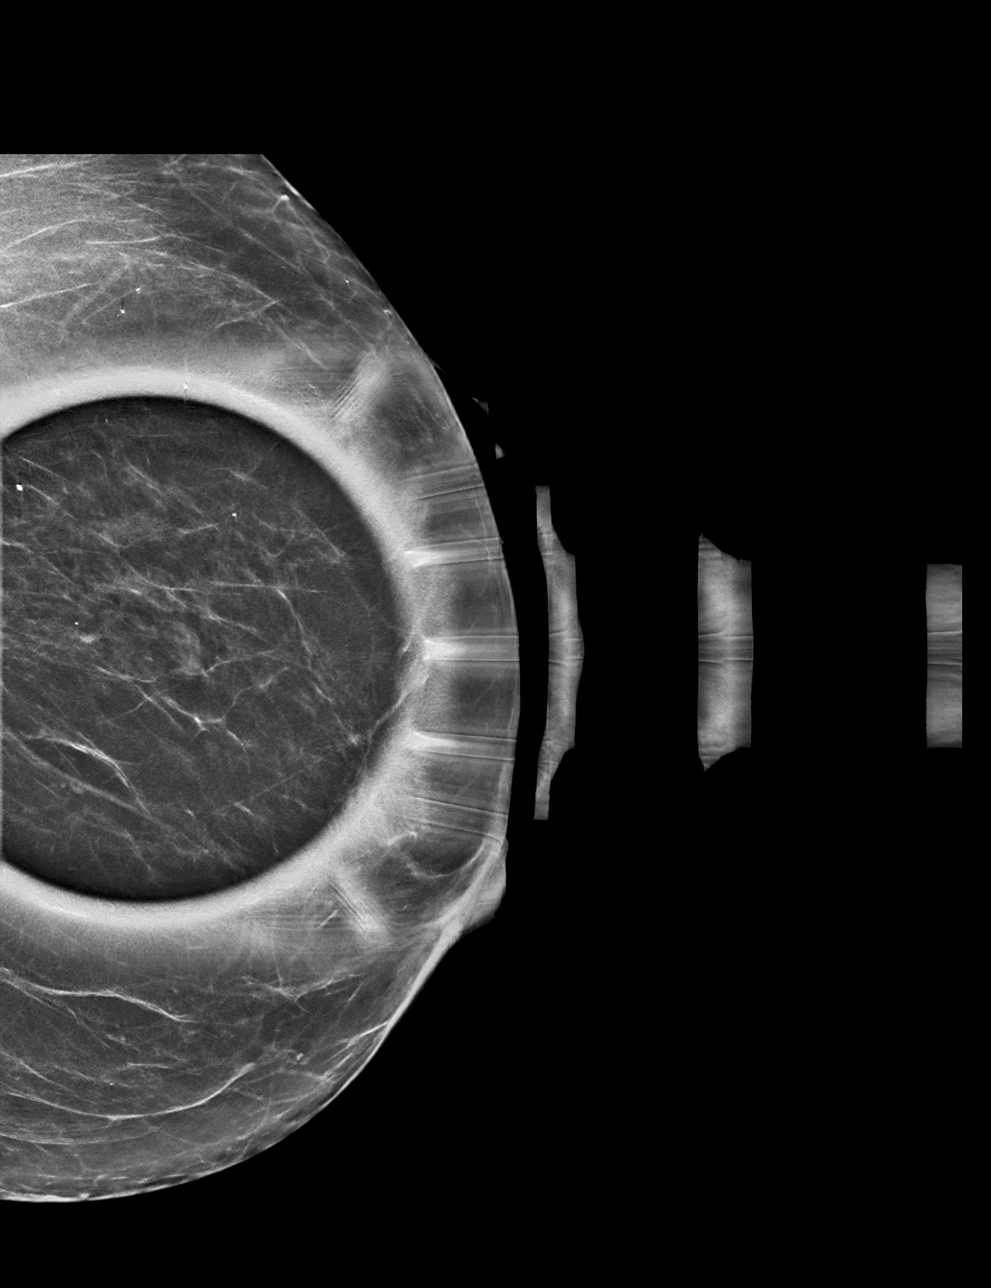

[L CC synth-2D]
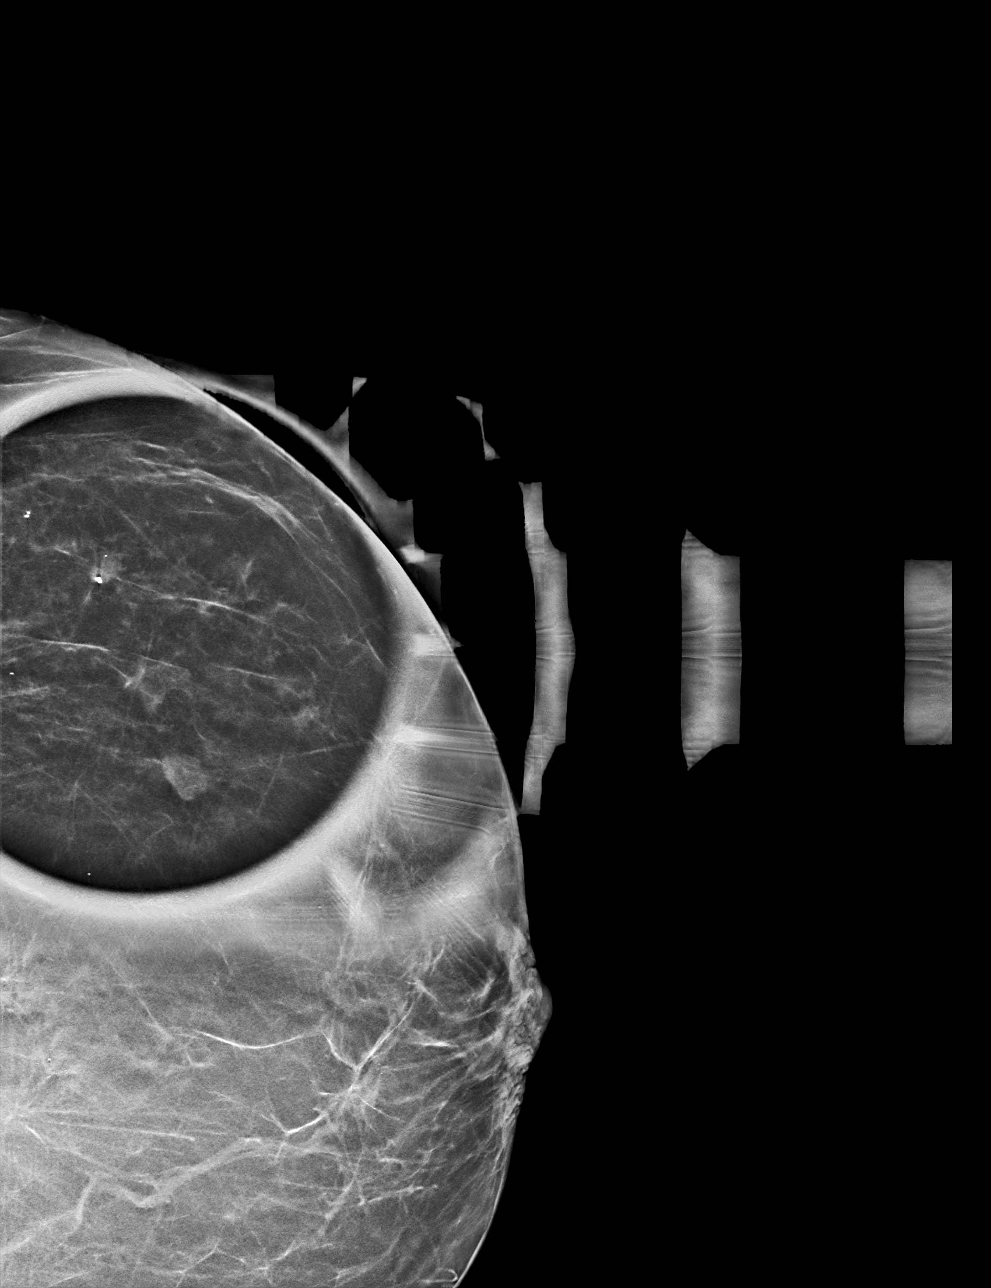

[L MLO tomo · tomo slice 29/58.0]
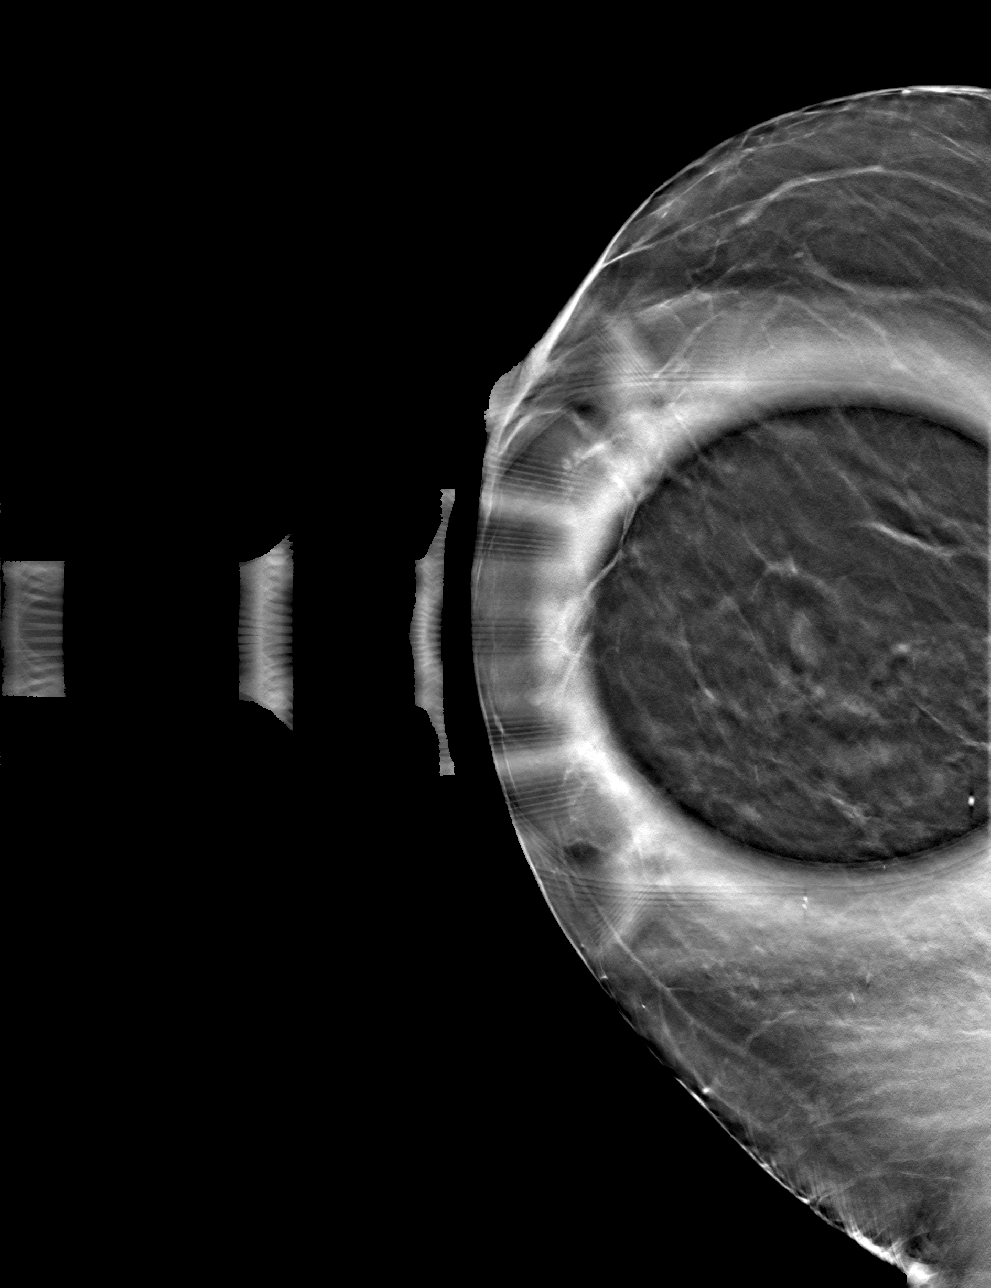

[L CC tomo · tomo slice 24/47.0]
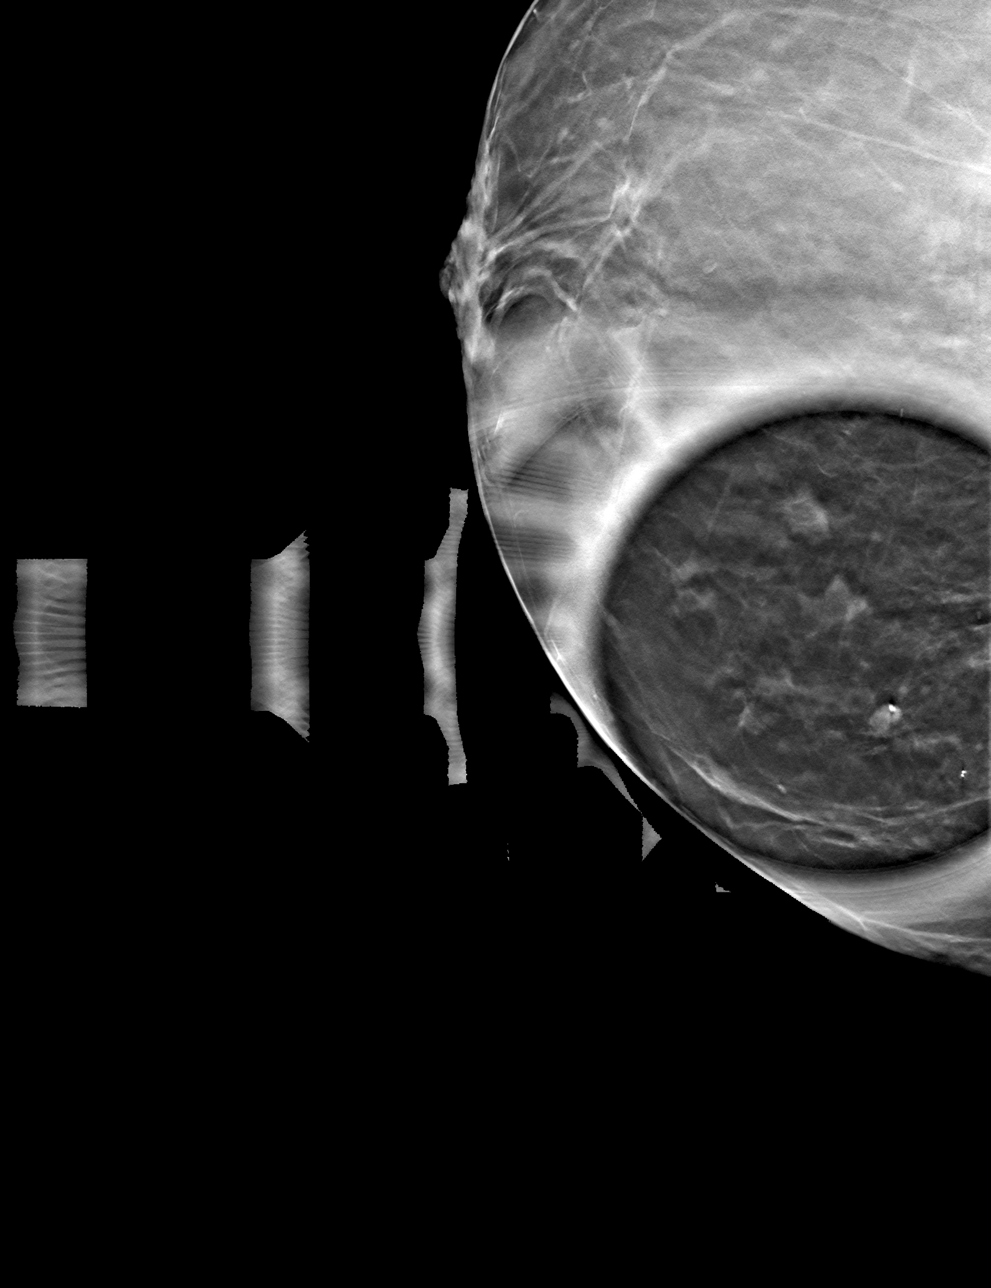

[4 of 12 positions shown; findings below may reference images not displayed]

ACR Breast Density Category b: There are scattered areas of
fibroglandular density.
FINDINGS: Additional images demonstrate an oval circumscribed subcentimeter
mass over the middle third of the outer midportion of the left
breast.

Targeted ultrasound is performed, showing an oval cyst over the 3
o'clock position of the left breast 4 cm from the nipple measuring 4
x 7 x 8 mm correlating to the mammographic finding.
IMPRESSION: 8 mm cyst over the 3 o'clock position of the left breast accounting
for the mammographic finding.

RECOMMENDATION:
Recommend continued annual bilateral screening mammographic
follow-up.

I have discussed the findings and recommendations with the patient.
Results were also provided in writing at the conclusion of the
visit. If applicable, a reminder letter will be sent to the patient
regarding the next appointment.

BI-RADS CATEGORY  2: Benign.

## 2020-03-20 ENCOUNTER — Other Ambulatory Visit (INDEPENDENT_AMBULATORY_CARE_PROVIDER_SITE_OTHER): Payer: Self-pay | Admitting: Gastroenterology

## 2020-08-27 DIAGNOSIS — C801 Malignant (primary) neoplasm, unspecified: Secondary | ICD-10-CM

## 2020-08-27 HISTORY — DX: Malignant (primary) neoplasm, unspecified: C80.1

## 2020-09-17 ENCOUNTER — Other Ambulatory Visit: Payer: Self-pay | Admitting: Internal Medicine

## 2020-09-17 DIAGNOSIS — Z1231 Encounter for screening mammogram for malignant neoplasm of breast: Secondary | ICD-10-CM

## 2020-10-01 ENCOUNTER — Other Ambulatory Visit: Payer: Self-pay

## 2020-10-01 ENCOUNTER — Ambulatory Visit
Admission: RE | Admit: 2020-10-01 | Discharge: 2020-10-01 | Disposition: A | Discharge status: Home / Self Care | Payer: Medicare PPO | Source: Ambulatory Visit | Attending: Internal Medicine | Admitting: Internal Medicine

## 2020-10-01 DIAGNOSIS — Z1231 Encounter for screening mammogram for malignant neoplasm of breast: Secondary | ICD-10-CM | POA: Diagnosis present

## 2021-09-07 ENCOUNTER — Other Ambulatory Visit: Payer: Self-pay | Admitting: Internal Medicine

## 2021-09-07 DIAGNOSIS — Z1231 Encounter for screening mammogram for malignant neoplasm of breast: Secondary | ICD-10-CM

## 2021-10-02 ENCOUNTER — Ambulatory Visit
Admission: RE | Admit: 2021-10-02 | Discharge: 2021-10-02 | Disposition: A | Discharge status: Home / Self Care | Payer: Medicare PPO | Source: Ambulatory Visit | Attending: Internal Medicine | Admitting: Internal Medicine

## 2021-10-02 ENCOUNTER — Other Ambulatory Visit: Payer: Self-pay

## 2021-10-02 DIAGNOSIS — Z1231 Encounter for screening mammogram for malignant neoplasm of breast: Secondary | ICD-10-CM | POA: Insufficient documentation

## 2022-09-01 ENCOUNTER — Other Ambulatory Visit: Payer: Self-pay | Admitting: Internal Medicine

## 2022-09-01 DIAGNOSIS — Z1231 Encounter for screening mammogram for malignant neoplasm of breast: Secondary | ICD-10-CM

## 2022-10-04 ENCOUNTER — Ambulatory Visit
Admission: RE | Admit: 2022-10-04 | Discharge: 2022-10-04 | Disposition: A | Discharge status: Home / Self Care | Payer: Medicare PPO | Source: Ambulatory Visit | Attending: Internal Medicine | Admitting: Internal Medicine

## 2022-10-04 DIAGNOSIS — Z1231 Encounter for screening mammogram for malignant neoplasm of breast: Secondary | ICD-10-CM | POA: Diagnosis present

## 2023-01-24 ENCOUNTER — Ambulatory Visit (LOCAL_COMMUNITY_HEALTH_CENTER): Payer: Medicare PPO

## 2023-01-24 DIAGNOSIS — Z23 Encounter for immunization: Secondary | ICD-10-CM | POA: Diagnosis not present

## 2023-01-24 DIAGNOSIS — Z719 Counseling, unspecified: Secondary | ICD-10-CM

## 2023-01-24 NOTE — Progress Notes (Signed)
  Are you feeling sick today? No   Have you ever received a dose of COVID-19 Vaccine? [employer], [place], [place], [place], Other) Yes  If yes, which vaccine and how many doses?   4 doses Pfizer   Did you bring the vaccination record card or other documentation?  Yes   Do you have a health condition or are undergoing treatment that makes you moderately or severely immunocompromised? This would include, but not be limited to: cancer, HIV, organ transplant, immunosuppressive therapy/high-dose corticosteroids, or moderate/severe primary immunodeficiency.  No  Have you received COVID-19 vaccine before or during hematopoietic cell transplant (HCT) or CAR-T-cell therapies? No  Have you ever had an allergic reaction to: (This would include a severe allergic reaction or a reaction that caused hives, swelling, or respiratory distress, including wheezing.) A component of a COVID-19 vaccine or a previous dose of COVID-19 vaccine? No   Have you ever had an allergic reaction to another vaccine (other thanCOVID-19 vaccine) or an injectable medication? (This would include a severe allergic reaction or a reaction that caused hives, swelling, or respiratory distress, including wheezing.)   No    Do you have a history of any of the following:  Myocarditis or Pericarditis No  Dermal fillers:  No  Multisystem Inflammatory Syndrome (MIS-C or MIS-A)? No  COVID-19 disease within the past 3 months? No  Vaccinated with monkeypox vaccine in the last 4 weeks? No  Patient seen in nurse clinic for COVID vaccine. VIS provided. Comirnaty/Pfizer +12Y 2023-24 IM in left deltoid.  Tolerated well. [facility] updated and copy provided. COVID card updated and copy provided. Waited 15 minutes.  After vaccine care provided.

## 2023-04-15 ENCOUNTER — Encounter: Payer: Self-pay | Admitting: *Deleted

## 2023-04-18 ENCOUNTER — Ambulatory Visit: Payer: Medicare PPO | Admitting: Certified Registered"

## 2023-04-18 ENCOUNTER — Encounter: Payer: Self-pay | Admitting: *Deleted

## 2023-04-18 ENCOUNTER — Ambulatory Visit
Admission: RE | Admit: 2023-04-18 | Discharge: 2023-04-18 | Disposition: A | Discharge status: Home / Self Care | Payer: Medicare PPO | Attending: Gastroenterology | Admitting: Gastroenterology

## 2023-04-18 ENCOUNTER — Encounter
Admission: RE | Disposition: A | Discharge status: Home / Self Care | Payer: Self-pay | Source: Home / Self Care | Attending: Gastroenterology

## 2023-04-18 ENCOUNTER — Other Ambulatory Visit: Payer: Self-pay

## 2023-04-18 DIAGNOSIS — E119 Type 2 diabetes mellitus without complications: Secondary | ICD-10-CM | POA: Insufficient documentation

## 2023-04-18 DIAGNOSIS — D123 Benign neoplasm of transverse colon: Secondary | ICD-10-CM | POA: Diagnosis not present

## 2023-04-18 DIAGNOSIS — K640 First degree hemorrhoids: Secondary | ICD-10-CM | POA: Insufficient documentation

## 2023-04-18 DIAGNOSIS — K219 Gastro-esophageal reflux disease without esophagitis: Secondary | ICD-10-CM | POA: Insufficient documentation

## 2023-04-18 DIAGNOSIS — Z1211 Encounter for screening for malignant neoplasm of colon: Secondary | ICD-10-CM | POA: Insufficient documentation

## 2023-04-18 DIAGNOSIS — I10 Essential (primary) hypertension: Secondary | ICD-10-CM | POA: Diagnosis not present

## 2023-04-18 DIAGNOSIS — Z7984 Long term (current) use of oral hypoglycemic drugs: Secondary | ICD-10-CM | POA: Insufficient documentation

## 2023-04-18 DIAGNOSIS — Z7985 Long-term (current) use of injectable non-insulin antidiabetic drugs: Secondary | ICD-10-CM | POA: Insufficient documentation

## 2023-04-18 DIAGNOSIS — Z794 Long term (current) use of insulin: Secondary | ICD-10-CM | POA: Diagnosis not present

## 2023-04-18 HISTORY — PX: COLONOSCOPY WITH PROPOFOL: SHX5780

## 2023-04-18 LAB — GLUCOSE, CAPILLARY: Glucose-Capillary: 158 mg/dL — ABNORMAL HIGH (ref 70–99)

## 2023-04-18 SURGERY — COLONOSCOPY WITH PROPOFOL
Anesthesia: General

## 2023-04-18 MED ORDER — STERILE WATER FOR IRRIGATION IR SOLN
Status: DC | PRN
  Administered 2023-04-18: 50 mL

## 2023-04-18 MED ORDER — PROPOFOL 10 MG/ML IV BOLUS
INTRAVENOUS | Status: DC | PRN
  Administered 2023-04-18: 100 mg via INTRAVENOUS
  Administered 2023-04-18: 150 ug/kg/min via INTRAVENOUS

## 2023-04-18 MED ORDER — LIDOCAINE HCL (CARDIAC) PF 100 MG/5ML IV SOSY
PREFILLED_SYRINGE | INTRAVENOUS | Status: DC | PRN
  Administered 2023-04-18: 100 mg via INTRAVENOUS

## 2023-04-18 MED ORDER — SODIUM CHLORIDE 0.9 % IV SOLN: INTRAVENOUS | Status: DC

## 2023-04-18 NOTE — Op Note (Addendum)
[facility] Gastroenterology Patient Name: Tara Gilbert Procedure Date: 04/18/2023 1:20 PM MRN: [id number] Account #: [id number] Date of Birth: 10/26/1946 Admit Type: Outpatient Age: 77 Room: [facility] ENDO ROOM 3 Gender: Female Note Status: Finalized Instrument Name: Prentice Docker [id number] Procedure:             Colonoscopy Indications:           Surveillance: Personal history of adenomatous polyps                         on last colonoscopy 5 years ago Providers:             Eather Colas MD, MD Medicines:             Monitored Anesthesia Care Complications:         No immediate complications. Estimated blood loss:                         Minimal. Procedure:             Pre-Anesthesia Assessment:                        - Prior to the procedure, a History and Physical was                         performed, and patient medications and allergies were                         reviewed. The patient is competent. The risks and                         benefits of the procedure and the sedation options and                         risks were discussed with the patient. All questions                         were answered and informed consent was obtained.                         Patient identification and proposed procedure were                         verified by the physician, the nurse, the                         anesthesiologist, the anesthetist and the technician                         in the endoscopy suite. Mental Status Examination:                         alert and oriented. Airway Examination: normal                         oropharyngeal airway and neck mobility. Respiratory                         Examination: clear to auscultation. CV Examination:  normal. Prophylactic Antibiotics: The patient does not                         require prophylactic antibiotics. Prior                         Anticoagulants: The patient has taken no anticoagulant                          or antiplatelet agents. ASA Grade Assessment: II - A                         patient with mild systemic disease. After reviewing                         the risks and benefits, the patient was deemed in                         satisfactory condition to undergo the procedure. The                         anesthesia plan was to use monitored anesthesia care                         (MAC). Immediately prior to administration of                         medications, the patient was re-assessed for adequacy                         to receive sedatives. The heart rate, respiratory                         rate, oxygen saturations, blood pressure, adequacy of                         pulmonary ventilation, and response to care were                         monitored throughout the procedure. The physical                         status of the patient was re-assessed after the                         procedure.                        After obtaining informed consent, the colonoscope was                         passed under direct vision. Throughout the procedure,                         the patient's blood pressure, pulse, and oxygen                         saturations were monitored continuously. The  Colonoscope was introduced through the anus and                         advanced to the the terminal ileum. The colonoscopy                         was performed without difficulty. The patient                         tolerated the procedure well. The quality of the bowel                         preparation was adequate to identify polyps. The                         terminal ileum, ileocecal valve, appendiceal orifice,                         and rectum were photographed. Findings:      The perianal and digital rectal examinations were normal.      The terminal ileum appeared normal.      A 3 mm polyp was found in the distal transverse colon. The polyp was        sessile. The polyp was removed with a cold snare. Resection and       retrieval were complete. Estimated blood loss was minimal.      Internal hemorrhoids were found during retroflexion. The hemorrhoids       were Grade I (internal hemorrhoids that do not prolapse).      The exam was otherwise without abnormality on direct and retroflexion       views. Impression:            - The examined portion of the ileum was normal.                        - One 3 mm polyp in the distal transverse colon,                         removed with a cold snare. Resected and retrieved.                        - Internal hemorrhoids.                        - The examination was otherwise normal on direct and                         retroflexion views. Recommendation:        - Discharge patient to home.                        - Resume previous diet.                        - Continue present medications.                        - Await pathology results.                        -  Repeat colonoscopy is not recommended due to current                         age (73 years or older) for surveillance.                        - Return to referring physician as previously                         scheduled. Procedure Code(s):     --- Professional ---                        [id number], Colonoscopy, flexible; with removal of                         tumor(s), polyp(s), or other lesion(s) by snare                         technique Diagnosis Code(s):     --- Professional ---                        Z86.010, Personal history of colonic polyps                        K64.0, First degree hemorrhoids                        D12.3, Benign neoplasm of transverse colon (hepatic                         flexure or splenic flexure) CPT copyright 2022 [employer]. All rights reserved. The codes documented in this report are preliminary and upon coder review may  be revised to meet current compliance  requirements. Eather Colas MD, MD 04/18/2023 2:11:42 PM Number of Addenda: 0 Note Initiated On: 04/18/2023 1:20 PM Scope Withdrawal Time: 0 hours 14 minutes 28 seconds  Total Procedure Duration: 0 hours 25 minutes 15 seconds  Estimated Blood Loss:  Estimated blood loss was minimal.      [facility]

## 2023-04-18 NOTE — Anesthesia Preprocedure Evaluation (Signed)
Anesthesia Evaluation  Patient identified by MRN, date of birth, ID band Patient awake    Reviewed: Allergy & Precautions, NPO status , Patient's Chart, lab work & pertinent test results  History of Anesthesia Complications Negative for: history of anesthetic complications  Airway Mallampati: II   Neck ROM: Full    Dental   Bridge:   Pulmonary neg pulmonary ROS   Pulmonary exam normal breath sounds clear to auscultation       Cardiovascular hypertension, Normal cardiovascular exam Rhythm:Regular Rate:Normal     Neuro/Psych negative neurological ROS     GI/Hepatic ,GERD  ,,  Endo/Other  diabetes, Type 2, Insulin Dependent    Renal/GU negative Renal ROS     Musculoskeletal   Abdominal   Peds  Hematology negative hematology ROS (+)   Anesthesia Other Findings Last dose of Trulicity 04/07/23.  Reproductive/Obstetrics                             Anesthesia Physical Anesthesia Plan  ASA: 2  Anesthesia Plan: General   Post-op Pain Management:    Induction: Intravenous  PONV Risk Score and Plan: 3 and Propofol infusion, TIVA and Treatment may vary due to age or medical condition  Airway Management Planned: Natural Airway  Additional Equipment:   Intra-op Plan:   Post-operative Plan:   Informed Consent: I have reviewed the patients History and Physical, chart, labs and discussed the procedure including the risks, benefits and alternatives for the proposed anesthesia with the patient or authorized representative who has indicated his/her understanding and acceptance.       Plan Discussed with: CRNA  Anesthesia Plan Comments: (LMA/GETA backup discussed.  Patient consented for risks of anesthesia including but not limited to:  - adverse reactions to medications - damage to eyes, teeth, lips or other oral mucosa - nerve damage due to positioning  - sore throat or hoarseness -  damage to heart, brain, nerves, lungs, other parts of body or loss of life  Informed patient about role of CRNA in peri- and intra-operative care.  Patient voiced understanding.)       Anesthesia Quick Evaluation

## 2023-04-18 NOTE — Interval H&P Note (Signed)
History and Physical Interval Note:  04/18/2023 1:22 PM  Tara Gilbert  has presented today for surgery, with the diagnosis of diarrhea.  The various methods of treatment have been discussed with the patient and family. After consideration of risks, benefits and other options for treatment, the patient has consented to  Procedure(s): COLONOSCOPY WITH PROPOFOL (N/A) as a surgical intervention.  The patient's history has been reviewed, patient examined, no change in status, stable for surgery.  I have reviewed the patient's chart and labs.  Questions were answered to the patient's satisfaction.     Regis Bill  Ok to proceed with colonoscopy

## 2023-04-18 NOTE — H&P (Signed)
Outpatient short stay form Pre-procedure 04/18/2023  Regis Bill, MD  Primary Physician: Barbette Reichmann, MD  Reason for visit:  Surveillance  History of present illness:    77 y/o lady with history of hypertension, DM II, and osteoporosis here for colonoscopy for history of small TA on last colonoscopy in 2018. No blood thinners. No definite first degree relatives with GI malignancies. No significant abdominal surgeries.    Current Facility-Administered Medications:    0.9 %  sodium chloride infusion, , Intravenous, Continuous, Ezio Wieck, Rossie Muskrat, MD, Last Rate: 20 mL/hr at 04/18/23 1320, Continued from Pre-op at 04/18/23 1320  Medications Prior to Admission  Medication Sig Dispense Refill Last Dose   atorvastatin (LIPITOR) 40 MG tablet Take 40 mg by mouth daily.   04/18/2023 at 0600   Cholecalciferol 2000 units CAPS Take by mouth daily.   04/17/2023   cyanocobalamin 2000 MCG tablet Take 2,000 mcg by mouth daily.   04/17/2023   Insulin Glargine (LANTUS SOLOSTAR [place]) Inject 16 Units into the skin daily.   04/17/2023   lisinopril (PRINIVIL,ZESTRIL) 10 MG tablet Take 10 mg by mouth daily.   04/18/2023 at 0600   omeprazole (PRILOSEC) 40 MG capsule Take 40 mg by mouth daily.   04/17/2023   Butenafine HCl 1 % cream Apply topically daily.      Calcium Carbonate-Vitamin D (CALTRATE 600+D PO) Take by mouth daily.      cetirizine (ZYRTEC) 10 MG tablet Take 10 mg by mouth daily.      Dulaglutide (TRULICITY) 1.5 MG/0.5ML SOPN Inject into the skin every 7 (seven) days.   04/07/2023   fexofenadine (ALLEGRA) 180 MG tablet Take 180 mg by mouth daily.      metFORMIN (GLUCOPHAGE) 1000 MG tablet Take 1,000 mg by mouth 2 (two) times daily with a meal.   04/16/2023     Allergies  Allergen Reactions   Formaldehyde    Latex    Macrodantin [Nitrofurantoin]      Past Medical History:  Diagnosis Date   Cancer 08/2020   melanoma on face   Diabetes mellitus without complication    GERD  (gastroesophageal reflux disease)    Hyperlipidemia    Hypertension    Macular degeneration    Night muscle spasms    Osteoporosis    Seasonal allergies     Review of systems:  Otherwise negative.    Physical Exam  Gen: Alert, oriented. Appears stated age.  HEENT: PERRLA. Lungs: No respiratory distress CV: RRR Abd: soft, benign, no masses Ext: No edema    Planned procedures: Proceed with colonoscopy. The patient understands the nature of the planned procedure, indications, risks, alternatives and potential complications including but not limited to bleeding, infection, perforation, damage to internal organs and possible oversedation/side effects from anesthesia. The patient agrees and gives consent to proceed.  Please refer to procedure notes for findings, recommendations and patient disposition/instructions.     Regis Bill, MD [facility] Gastroenterology

## 2023-04-18 NOTE — Anesthesia Postprocedure Evaluation (Signed)
Anesthesia Post Note  Patient: Tara Gilbert  Procedure(s) Performed: COLONOSCOPY WITH PROPOFOL  Patient location during evaluation: PACU Anesthesia Type: General Level of consciousness: awake and alert, oriented and patient cooperative Pain management: pain level controlled Vital Signs Assessment: post-procedure vital signs reviewed and stable Respiratory status: spontaneous breathing, nonlabored ventilation and respiratory function stable Cardiovascular status: blood pressure returned to baseline and stable Postop Assessment: adequate PO intake Anesthetic complications: no   No notable events documented.   Last Vitals:  Vitals:   04/18/23 1244 04/18/23 1407  BP: (!) 192/62 (!) 106/59  Pulse: 62 79  Resp: 18 (!) 9  Temp: 36.5 C 36.5 C  SpO2: 100% 99%    Last Pain:  Vitals:   04/18/23 1429  TempSrc:   PainSc: 0-No pain                 Reed Breech

## 2023-04-18 NOTE — Transfer of Care (Signed)
Immediate Anesthesia Transfer of Care Note  Patient: Tara Gilbert  Procedure(s) Performed: COLONOSCOPY WITH PROPOFOL  Patient Location: PACU  Anesthesia Type:General  Level of Consciousness: drowsy  Airway & Oxygen Therapy: Patient Spontanous Breathing  Post-op Assessment: Report given to RN and Post -op Vital signs reviewed and stable  Post vital signs: Reviewed and stable  Last Vitals:  Vitals Value Taken Time  BP 106/59 04/18/23 1408  Temp 36.5 C 04/18/23 1407  Pulse 80 04/18/23 1409  Resp 13 04/18/23 1409  SpO2 98 % 04/18/23 1409  Vitals shown include unvalidated device data.  Last Pain:  Vitals:   04/18/23 1407  TempSrc: Temporal  PainSc: Asleep         Complications: No notable events documented.

## 2023-04-20 ENCOUNTER — Encounter: Payer: Self-pay | Admitting: Gastroenterology

## 2023-04-20 LAB — SURGICAL PATHOLOGY

## 2023-04-25 ENCOUNTER — Encounter: Payer: Self-pay | Admitting: Ophthalmology

## 2023-04-26 NOTE — Anesthesia Preprocedure Evaluation (Addendum)
Anesthesia Evaluation  Patient identified by MRN, date of birth, ID band Patient awake    Reviewed: Allergy & Precautions, H&P , NPO status , Patient's Chart, lab work & pertinent test results  Airway Mallampati: III  TM Distance: >3 FB Neck ROM: Full    Dental no notable dental hx.  Dental bridge :   Pulmonary sleep apnea and Continuous Positive Airway Pressure Ventilation    Pulmonary exam normal breath sounds clear to auscultation       Cardiovascular hypertension, Normal cardiovascular exam Rhythm:Regular Rate:Normal     Neuro/Psych negative neurological ROS  negative psych ROS   GI/Hepatic Neg liver ROS,GERD  ,,  Endo/Other  diabetes    Renal/GU negative Renal ROS  negative genitourinary   Musculoskeletal negative musculoskeletal ROS (+)    Abdominal   Peds negative pediatric ROS (+)  Hematology negative hematology ROS (+)   Anesthesia Other Findings Hyperlipidemia  GERD (gastroesophageal reflux disease) Hypertension  Macular degeneration Seasonal allergies  Osteoporosis Diabetes mellitus without complication (HCC)  Night muscle spasms Cancer (HCC)  Dental bridge present Sleep apnea     Reproductive/Obstetrics negative OB ROS                             Anesthesia Physical Anesthesia Plan  ASA: 2  Anesthesia Plan: MAC   Post-op Pain Management:    Induction: Intravenous  PONV Risk Score and Plan:   Airway Management Planned: Natural Airway and Nasal Cannula  Additional Equipment:   Intra-op Plan:   Post-operative Plan:   Informed Consent: I have reviewed the patients History and Physical, chart, labs and discussed the procedure including the risks, benefits and alternatives for the proposed anesthesia with the patient or authorized representative who has indicated his/her understanding and acceptance.     Dental Advisory Given  Plan Discussed with:  Anesthesiologist, CRNA and Surgeon  Anesthesia Plan Comments: (Patient consented for risks of anesthesia including but not limited to:  - adverse reactions to medications - damage to eyes, teeth, lips or other oral mucosa - nerve damage due to positioning  - sore throat or hoarseness - Damage to heart, brain, nerves, lungs, other parts of body or loss of life  Patient voiced understanding.)       Anesthesia Quick Evaluation

## 2023-04-27 NOTE — Discharge Instructions (Signed)

## 2023-05-02 ENCOUNTER — Encounter
Admission: RE | Disposition: A | Discharge status: Home / Self Care | Payer: Self-pay | Source: Home / Self Care | Attending: Ophthalmology

## 2023-05-02 ENCOUNTER — Ambulatory Visit
Admission: RE | Admit: 2023-05-02 | Discharge: 2023-05-02 | Disposition: A | Discharge status: Home / Self Care | Payer: Medicare PPO | Attending: Ophthalmology | Admitting: Ophthalmology

## 2023-05-02 ENCOUNTER — Ambulatory Visit: Payer: Medicare PPO | Admitting: Anesthesiology

## 2023-05-02 ENCOUNTER — Encounter: Payer: Self-pay | Admitting: Ophthalmology

## 2023-05-02 ENCOUNTER — Other Ambulatory Visit: Payer: Self-pay

## 2023-05-02 DIAGNOSIS — H2511 Age-related nuclear cataract, right eye: Secondary | ICD-10-CM | POA: Insufficient documentation

## 2023-05-02 DIAGNOSIS — Z7984 Long term (current) use of oral hypoglycemic drugs: Secondary | ICD-10-CM | POA: Diagnosis not present

## 2023-05-02 DIAGNOSIS — G4730 Sleep apnea, unspecified: Secondary | ICD-10-CM | POA: Insufficient documentation

## 2023-05-02 DIAGNOSIS — Z85820 Personal history of malignant melanoma of skin: Secondary | ICD-10-CM | POA: Insufficient documentation

## 2023-05-02 DIAGNOSIS — M810 Age-related osteoporosis without current pathological fracture: Secondary | ICD-10-CM | POA: Insufficient documentation

## 2023-05-02 DIAGNOSIS — Z794 Long term (current) use of insulin: Secondary | ICD-10-CM | POA: Insufficient documentation

## 2023-05-02 DIAGNOSIS — K219 Gastro-esophageal reflux disease without esophagitis: Secondary | ICD-10-CM | POA: Diagnosis not present

## 2023-05-02 DIAGNOSIS — E1136 Type 2 diabetes mellitus with diabetic cataract: Secondary | ICD-10-CM | POA: Diagnosis not present

## 2023-05-02 DIAGNOSIS — Z7985 Long-term (current) use of injectable non-insulin antidiabetic drugs: Secondary | ICD-10-CM | POA: Insufficient documentation

## 2023-05-02 DIAGNOSIS — I10 Essential (primary) hypertension: Secondary | ICD-10-CM | POA: Diagnosis not present

## 2023-05-02 DIAGNOSIS — E785 Hyperlipidemia, unspecified: Secondary | ICD-10-CM | POA: Insufficient documentation

## 2023-05-02 HISTORY — PX: CATARACT EXTRACTION W/PHACO: SHX586

## 2023-05-02 HISTORY — DX: Presence of dental prosthetic device (complete) (partial): Z97.2

## 2023-05-02 HISTORY — DX: Sleep apnea, unspecified: G47.30

## 2023-05-02 LAB — GLUCOSE, CAPILLARY: Glucose-Capillary: 110 mg/dL — ABNORMAL HIGH (ref 70–99)

## 2023-05-02 SURGERY — PHACOEMULSIFICATION, CATARACT, WITH IOL INSERTION
Anesthesia: Monitor Anesthesia Care | Site: Eye | Laterality: Right

## 2023-05-02 MED ORDER — SIGHTPATH DOSE#1 BSS IO SOLN
INTRAOCULAR | Status: DC | PRN
  Administered 2023-05-02: 87 mL via OPHTHALMIC

## 2023-05-02 MED ORDER — MIDAZOLAM HCL 2 MG/2ML IJ SOLN
INTRAMUSCULAR | Status: DC | PRN
  Administered 2023-05-02: 1 mg via INTRAVENOUS

## 2023-05-02 MED ORDER — SIGHTPATH DOSE#1 BSS IO SOLN
INTRAOCULAR | Status: DC | PRN
  Administered 2023-05-02: 15 mL

## 2023-05-02 MED ORDER — TETRACAINE HCL 0.5 % OP SOLN
1.0000 [drp] | OPHTHALMIC | Status: DC | PRN
  Administered 2023-05-02 (×3): 1 [drp] via OPHTHALMIC

## 2023-05-02 MED ORDER — LACTATED RINGERS IV SOLN: INTRAVENOUS | Status: DC

## 2023-05-02 MED ORDER — SIGHTPATH DOSE#1 NA HYALUR & NA CHOND-NA HYALUR IO KIT
PACK | INTRAOCULAR | Status: DC | PRN
  Administered 2023-05-02: 1 via OPHTHALMIC

## 2023-05-02 MED ORDER — LIDOCAINE HCL (PF) 2 % IJ SOLN
INTRAOCULAR | Status: DC | PRN
  Administered 2023-05-02: 1 mL via INTRAOCULAR

## 2023-05-02 MED ORDER — ARMC OPHTHALMIC DILATING DROPS
1.0000 | OPHTHALMIC | Status: DC | PRN
  Administered 2023-05-02 (×3): 1 via OPHTHALMIC

## 2023-05-02 MED ORDER — FENTANYL CITRATE (PF) 100 MCG/2ML IJ SOLN
INTRAMUSCULAR | Status: DC | PRN
  Administered 2023-05-02: 50 ug via INTRAVENOUS

## 2023-05-02 MED ORDER — MOXIFLOXACIN HCL 0.5 % OP SOLN
OPHTHALMIC | Status: DC | PRN
  Administered 2023-05-02: .2 mL via OPHTHALMIC

## 2023-05-02 SURGICAL SUPPLY — 18 items
CANNULA ANT/CHMB 27G (MISCELLANEOUS) IMPLANT
CANNULA ANT/CHMB 27GA (MISCELLANEOUS) IMPLANT
CATARACT SUITE SIGHTPATH (MISCELLANEOUS) ×1 IMPLANT
DISSECTOR HYDRO NUCLEUS 50X22 (MISCELLANEOUS) ×1 IMPLANT
FEE CATARACT SUITE SIGHTPATH (MISCELLANEOUS) ×1 IMPLANT
GLOVE SURG GAMMEX PI TX LF 7.5 (GLOVE) ×1 IMPLANT
GLOVE SURG SYN 8.5  E (GLOVE) ×1
GLOVE SURG SYN 8.5 E (GLOVE) ×1 IMPLANT
GLOVE SURG SYN 8.5 PF PI (GLOVE) ×1 IMPLANT
LENS IOL TECNIS EYHANCE 21.5 (Intraocular Lens) IMPLANT
NDL FILTER BLUNT 18X1 1/2 (NEEDLE) ×1 IMPLANT
NEEDLE FILTER BLUNT 18X1 1/2 (NEEDLE) ×1 IMPLANT
PACK VIT ANT 23G (MISCELLANEOUS) IMPLANT
RING MALYGIN (MISCELLANEOUS) IMPLANT
SUT ETHILON 10-0 CS-B-6CS-B-6 (SUTURE)
SUTURE EHLN 10-0 CS-B-6CS-B-6 (SUTURE) IMPLANT
SYR 3ML LL SCALE MARK (SYRINGE) ×1 IMPLANT
SYR 5ML LL (SYRINGE) ×1 IMPLANT

## 2023-05-02 NOTE — Op Note (Signed)
OPERATIVE NOTE  JONITA TRUAN [id number] 05/02/2023   PREOPERATIVE DIAGNOSIS:  Nuclear sclerotic cataract right eye.  H25.11   POSTOPERATIVE DIAGNOSIS:    Nuclear sclerotic cataract right eye.     PROCEDURE:  Phacoemusification with posterior chamber intraocular lens placement of the right eye   LENS:   Implant Name Type Inv. Item Serial No. Manufacturer Lot No. LRB No. Used Action  LENS IOL TECNIS EYHANCE 21.5 - [id number] Intraocular Lens LENS IOL TECNIS EYHANCE 21.5 [id number] [employer]  Right 1 Implanted       Procedure(s) with comments: CATARACT EXTRACTION PHACO AND INTRAOCULAR LENS PLACEMENT (IOC) RIGHT DIABETIC (Right) - 3.60 0:34.6  DIB00 +21.5   ULTRASOUND TIME: 0 minutes 34 seconds.  CDE 3.60   SURGEON:  Willey Blade, MD, MPH  ANESTHESIOLOGIST: Anesthesiologist: Marisue Humble, MD CRNA: Emeterio Reeve, CRNA   ANESTHESIA:  Topical with tetracaine drops augmented with 1% preservative-free intracameral lidocaine.  ESTIMATED BLOOD LOSS: less than 1 mL.   COMPLICATIONS:  None.   DESCRIPTION OF PROCEDURE:  The patient was identified in the holding room and transported to the operating room and placed in the supine position under the operating microscope.  The right eye was identified as the operative eye and it was prepped and draped in the usual sterile ophthalmic fashion.   A 1.0 millimeter clear-corneal paracentesis was made at the 10:30 position. 0.5 ml of preservative-free 1% lidocaine with epinephrine was injected into the anterior chamber.  The anterior chamber was filled with viscoelastic.  A 2.4 millimeter keratome was used to make a near-clear corneal incision at the 8:00 position.  A curvilinear capsulorrhexis was made with a cystotome and capsulorrhexis forceps.  Balanced salt solution was used to hydrodissect and hydrodelineate the nucleus.   Phacoemulsification was then used in stop and chop fashion to remove the lens nucleus and epinucleus.  The  remaining cortex was then removed using the irrigation and aspiration handpiece. Viscoelastic was then placed into the capsular bag to distend it for lens placement.  A lens was then injected into the capsular bag.  The remaining viscoelastic was aspirated.   Wounds were hydrated with balanced salt solution.  The anterior chamber was inflated to a physiologic pressure with balanced salt solution.   Intracameral vigamox 0.1 mL undiluted was injected into the eye and a drop placed onto the ocular surface.  No wound leaks were noted.  The patient was taken to the recovery room in stable condition without complications of anesthesia or surgery  Willey Blade 05/02/2023, 8:23 AM

## 2023-05-02 NOTE — Anesthesia Postprocedure Evaluation (Signed)
Anesthesia Post Note  Patient: Tara Gilbert  Procedure(s) Performed: CATARACT EXTRACTION PHACO AND INTRAOCULAR LENS PLACEMENT (IOC) RIGHT DIABETIC (Right: Eye)  Patient location during evaluation: PACU Anesthesia Type: MAC Level of consciousness: awake and alert Pain management: pain level controlled Vital Signs Assessment: post-procedure vital signs reviewed and stable Respiratory status: spontaneous breathing, nonlabored ventilation, respiratory function stable and patient connected to nasal cannula oxygen Cardiovascular status: stable and blood pressure returned to baseline Postop Assessment: no apparent nausea or vomiting Anesthetic complications: no   No notable events documented.   Last Vitals:  Vitals:   05/02/23 0830 05/02/23 0832  BP: (!) 121/56   Pulse: 68 75  Resp: 13 16  Temp:    SpO2: 94% 95%    Last Pain:  Vitals:   05/02/23 0830  TempSrc:   PainSc: 0-No pain                 Blakelynn Scheeler C Katilin Raynes

## 2023-05-02 NOTE — Transfer of Care (Signed)
Immediate Anesthesia Transfer of Care Note  Patient: Tara Gilbert  Procedure(s) Performed: CATARACT EXTRACTION PHACO AND INTRAOCULAR LENS PLACEMENT (IOC) RIGHT DIABETIC (Right: Eye)  Patient Location: PACU  Anesthesia Type: MAC  Level of Consciousness: awake, alert  and patient cooperative  Airway and Oxygen Therapy: Patient Spontanous Breathing and Patient connected to supplemental oxygen  Post-op Assessment: Post-op Vital signs reviewed, Patient's Cardiovascular Status Stable, Respiratory Function Stable, Patent Airway and No signs of Nausea or vomiting  Post-op Vital Signs: Reviewed and stable  Complications: No notable events documented.

## 2023-05-02 NOTE — H&P (Signed)
[facility]   Primary Care Physician:  Barbette Reichmann, MD Ophthalmologist: Dr. Willey Blade  Pre-Procedure History & Physical: HPI:  Tara Gilbert is a 77 y.o. female here for cataract surgery.   Past Medical History:  Diagnosis Date   Cancer (HCC) 08/2020   melanoma on face   Dental bridge present    bottom right   Diabetes mellitus without complication (HCC)    GERD (gastroesophageal reflux disease)    Hyperlipidemia    Hypertension    Macular degeneration    Night muscle spasms    Osteoporosis    Seasonal allergies    Sleep apnea    CPAP    Past Surgical History:  Procedure Laterality Date   BREAST CYST ASPIRATION Right    neg   COLONOSCOPY     COLONOSCOPY WITH PROPOFOL N/A 10/25/2017   Procedure: COLONOSCOPY WITH PROPOFOL;  Surgeon: Christena Deem, MD;  Location: [facility] ENDOSCOPY;  Service: Endoscopy;  Laterality: N/A;   COLONOSCOPY WITH PROPOFOL N/A 04/18/2023   Procedure: COLONOSCOPY WITH PROPOFOL;  Surgeon: Regis Bill, MD;  Location: [facility] ENDOSCOPY;  Service: Endoscopy;  Laterality: N/A;   ESOPHAGOGASTRODUODENOSCOPY (EGD) WITH PROPOFOL N/A 10/25/2017   Procedure: ESOPHAGOGASTRODUODENOSCOPY (EGD) WITH PROPOFOL;  Surgeon: Christena Deem, MD;  Location: [facility] ENDOSCOPY;  Service: Endoscopy;  Laterality: N/A;   UPPER GASTROINTESTINAL ENDOSCOPY      Prior to Admission medications   Medication Sig Start Date End Date Taking? Authorizing Provider  amLODipine (NORVASC) 2.5 MG tablet Take 2.5 mg by mouth daily.   Yes [provider]  atorvastatin (LIPITOR) 40 MG tablet Take 40 mg by mouth daily.   Yes [provider]  Calcium Carbonate-Vitamin D (CALTRATE 600+D PO) Take by mouth daily.   Yes [provider]  cetirizine (ZYRTEC) 10 MG tablet Take 10 mg by mouth daily.   Yes [provider]  Cholecalciferol 2000 units CAPS Take by mouth daily.   Yes [provider]  cyanocobalamin 2000 MCG tablet Take  2,000 mcg by mouth daily.   Yes [provider]  Dulaglutide (TRULICITY) 1.5 MG/0.5ML SOPN Inject into the skin every 7 (seven) days.   Yes [provider]  ferrous sulfate 324 MG TBEC Take 324 mg by mouth daily with breakfast.   Yes [provider]  fexofenadine (ALLEGRA) 180 MG tablet Take 180 mg by mouth daily.   Yes [provider]  Insulin Glargine (LANTUS SOLOSTAR [place]) Inject 22 Units into the skin daily.   Yes [provider]  lisinopril (PRINIVIL,ZESTRIL) 10 MG tablet Take 10 mg by mouth daily.   Yes [provider]  Melatonin 10 MG TABS Take by mouth at bedtime.   Yes [provider]  metFORMIN (GLUCOPHAGE) 1000 MG tablet Take 1,000 mg by mouth 2 (two) times daily with a meal.   Yes [provider]  Multiple Vitamins-Minerals (PRESERVISION AREDS 2 PO) Take 2 tablets by mouth 2 (two) times daily.   Yes [provider]  Omega-3 Fatty Acids (FISH OIL) 1000 MG CAPS Take by mouth daily.   Yes [provider]  omeprazole (PRILOSEC) 40 MG capsule Take 40 mg by mouth daily.   Yes [provider]  Butenafine HCl 1 % cream Apply topically daily.    [provider]    Allergies as of 04/22/2023 - Review Complete 04/18/2023  Allergen Reaction Noted   Formaldehyde  10/24/2017   Latex  10/25/2017   Macrodantin [nitrofurantoin]  10/24/2017  Family History  Problem Relation Age of Onset   Breast cancer Neg Hx     Social History   Socioeconomic History   Marital status: Married    Spouse name: Not on file   Number of children: Not on file   Years of education: Not on file   Highest education level: Not on file  Occupational History   Not on file  Tobacco Use   Smoking status: Never   Smokeless tobacco: Never  Vaping Use   Vaping Use: Never used  Substance and Sexual Activity   Alcohol use: Never   Drug use: Never   Sexual activity: Not on file  Other Topics Concern   Not  on file  Social History Narrative   Not on file   Social Determinants of Health   Financial Resource Strain: Not on file  Food Insecurity: Not on file  Transportation Needs: Not on file  Physical Activity: Not on file  Stress: Not on file  Social Connections: Not on file  Intimate Partner Violence: Not on file    Review of Systems: See HPI, otherwise negative ROS  Physical Exam: BP (!) 175/69   Pulse 81   Temp 98.6 F (37 C) (Temporal)   Resp 18   Ht 5\' 4"  (1.626 m)   Wt 72.6 kg   SpO2 98%   BMI 27.46 kg/m  General:   Alert, cooperative in NAD Head:  Normocephalic and atraumatic. Respiratory:  Normal work of breathing. Cardiovascular:  RRR  Impression/Plan: Tara Gilbert is here for cataract surgery.  Risks, benefits, limitations, and alternatives regarding cataract surgery have been reviewed with the patient.  Questions have been answered.  All parties agreeable.   Willey Blade, MD  05/02/2023, 7:58 AM

## 2023-05-03 ENCOUNTER — Encounter: Payer: Self-pay | Admitting: Ophthalmology

## 2023-05-13 NOTE — Discharge Instructions (Signed)

## 2023-05-16 ENCOUNTER — Encounter
Admission: RE | Disposition: A | Discharge status: Home / Self Care | Payer: Self-pay | Source: Home / Self Care | Attending: Ophthalmology

## 2023-05-16 ENCOUNTER — Ambulatory Visit
Admission: RE | Admit: 2023-05-16 | Discharge: 2023-05-16 | Disposition: A | Discharge status: Home / Self Care | Payer: Medicare PPO | Attending: Ophthalmology | Admitting: Ophthalmology

## 2023-05-16 ENCOUNTER — Ambulatory Visit: Payer: Medicare PPO | Admitting: Anesthesiology

## 2023-05-16 ENCOUNTER — Other Ambulatory Visit: Payer: Self-pay

## 2023-05-16 ENCOUNTER — Encounter: Payer: Self-pay | Admitting: Ophthalmology

## 2023-05-16 DIAGNOSIS — G4730 Sleep apnea, unspecified: Secondary | ICD-10-CM | POA: Insufficient documentation

## 2023-05-16 DIAGNOSIS — E1136 Type 2 diabetes mellitus with diabetic cataract: Secondary | ICD-10-CM | POA: Insufficient documentation

## 2023-05-16 DIAGNOSIS — Z794 Long term (current) use of insulin: Secondary | ICD-10-CM | POA: Insufficient documentation

## 2023-05-16 DIAGNOSIS — Z7984 Long term (current) use of oral hypoglycemic drugs: Secondary | ICD-10-CM | POA: Diagnosis not present

## 2023-05-16 DIAGNOSIS — K219 Gastro-esophageal reflux disease without esophagitis: Secondary | ICD-10-CM | POA: Insufficient documentation

## 2023-05-16 DIAGNOSIS — I10 Essential (primary) hypertension: Secondary | ICD-10-CM | POA: Insufficient documentation

## 2023-05-16 DIAGNOSIS — H2512 Age-related nuclear cataract, left eye: Secondary | ICD-10-CM | POA: Diagnosis not present

## 2023-05-16 HISTORY — PX: CATARACT EXTRACTION W/PHACO: SHX586

## 2023-05-16 LAB — GLUCOSE, CAPILLARY: Glucose-Capillary: 138 mg/dL — ABNORMAL HIGH (ref 70–99)

## 2023-05-16 SURGERY — PHACOEMULSIFICATION, CATARACT, WITH IOL INSERTION
Anesthesia: Monitor Anesthesia Care | Laterality: Left

## 2023-05-16 MED ORDER — SIGHTPATH DOSE#1 NA HYALUR & NA CHOND-NA HYALUR IO KIT
PACK | INTRAOCULAR | Status: DC | PRN
  Administered 2023-05-16: 1 via OPHTHALMIC

## 2023-05-16 MED ORDER — TETRACAINE HCL 0.5 % OP SOLN
1.0000 [drp] | OPHTHALMIC | Status: DC | PRN
  Administered 2023-05-16 (×3): 1 [drp] via OPHTHALMIC

## 2023-05-16 MED ORDER — LIDOCAINE HCL (PF) 2 % IJ SOLN
INTRAOCULAR | Status: DC | PRN
  Administered 2023-05-16: 4 mL via INTRAOCULAR

## 2023-05-16 MED ORDER — MIDAZOLAM HCL 2 MG/2ML IJ SOLN
INTRAMUSCULAR | Status: DC | PRN
  Administered 2023-05-16 (×2): 1 mg via INTRAVENOUS

## 2023-05-16 MED ORDER — LACTATED RINGERS IV SOLN: INTRAVENOUS | Status: DC

## 2023-05-16 MED ORDER — MOXIFLOXACIN HCL 0.5 % OP SOLN
OPHTHALMIC | Status: DC | PRN
  Administered 2023-05-16: .2 mL via OPHTHALMIC

## 2023-05-16 MED ORDER — ARMC OPHTHALMIC DILATING DROPS
1.0000 | OPHTHALMIC | Status: DC | PRN
  Administered 2023-05-16 (×3): 1 via OPHTHALMIC

## 2023-05-16 MED ORDER — FENTANYL CITRATE (PF) 100 MCG/2ML IJ SOLN
INTRAMUSCULAR | Status: DC | PRN
  Administered 2023-05-16: 25 ug via INTRAVENOUS

## 2023-05-16 MED ORDER — SIGHTPATH DOSE#1 BSS IO SOLN
INTRAOCULAR | Status: DC | PRN
  Administered 2023-05-16: 95 mL via OPHTHALMIC

## 2023-05-16 MED ORDER — SIGHTPATH DOSE#1 BSS IO SOLN
INTRAOCULAR | Status: DC | PRN
  Administered 2023-05-16: 15 mL via INTRAOCULAR

## 2023-05-16 SURGICAL SUPPLY — 12 items
CATARACT SUITE SIGHTPATH (MISCELLANEOUS) ×1 IMPLANT
DISSECTOR HYDRO NUCLEUS 50X22 (MISCELLANEOUS) ×1 IMPLANT
FEE CATARACT SUITE SIGHTPATH (MISCELLANEOUS) ×1 IMPLANT
GLOVE SURG GAMMEX PI TX LF 7.5 (GLOVE) ×1 IMPLANT
GLOVE SURG SYN 8.5  E (GLOVE) ×1
GLOVE SURG SYN 8.5 E (GLOVE) ×1 IMPLANT
GLOVE SURG SYN 8.5 PF PI (GLOVE) ×1 IMPLANT
LENS IOL TECNIS EYHANCE 21.5 (Intraocular Lens) IMPLANT
NDL FILTER BLUNT 18X1 1/2 (NEEDLE) ×1 IMPLANT
NEEDLE FILTER BLUNT 18X1 1/2 (NEEDLE) ×1 IMPLANT
SYR 3ML LL SCALE MARK (SYRINGE) ×1 IMPLANT
SYR 5ML LL (SYRINGE) ×1 IMPLANT

## 2023-05-16 NOTE — Anesthesia Postprocedure Evaluation (Signed)
Anesthesia Post Note  Patient: TONJI GARRETT  Procedure(s) Performed: CATARACT EXTRACTION PHACO AND INTRAOCULAR LENS PLACEMENT (IOC) LEFT DIABETIC 3.67 00:36.2 (Left)  Patient location during evaluation: PACU Anesthesia Type: MAC Level of consciousness: awake and alert Pain management: pain level controlled Vital Signs Assessment: post-procedure vital signs reviewed and stable Respiratory status: spontaneous breathing, nonlabored ventilation, respiratory function stable and patient connected to nasal cannula oxygen Cardiovascular status: stable and blood pressure returned to baseline Postop Assessment: no apparent nausea or vomiting Anesthetic complications: no   No notable events documented.   Last Vitals:  Vitals:   05/16/23 0817 05/16/23 0821  BP: (!) 114/58 (!) 114/58  Pulse: 81 72  Resp: 12 13  Temp: 36.7 C 36.7 C  SpO2: 96% 93%    Last Pain:  Vitals:   05/16/23 0821  TempSrc:   PainSc: 0-No pain                 Marisue Humble

## 2023-05-16 NOTE — H&P (Signed)
[facility]   Primary Care Physician:  Barbette Reichmann, MD Ophthalmologist: Dr. Willey Blade  Pre-Procedure History & Physical: HPI:  Tara Gilbert is a 77 y.o. female here for cataract surgery.   Past Medical History:  Diagnosis Date   Cancer (HCC) 08/2020   melanoma on face   Dental bridge present    bottom right   Diabetes mellitus without complication (HCC)    GERD (gastroesophageal reflux disease)    Hyperlipidemia    Hypertension    Macular degeneration    Night muscle spasms    Osteoporosis    Seasonal allergies    Sleep apnea    CPAP    Past Surgical History:  Procedure Laterality Date   BREAST CYST ASPIRATION Right    neg   CATARACT EXTRACTION W/PHACO Right 05/02/2023   Procedure: CATARACT EXTRACTION PHACO AND INTRAOCULAR LENS PLACEMENT (IOC) RIGHT DIABETIC;  Surgeon: Nevada Crane, MD;  Location: [facility];  Service: Ophthalmology;  Laterality: Right;  3.60 0:34.6   COLONOSCOPY     COLONOSCOPY WITH PROPOFOL N/A 10/25/2017   Procedure: COLONOSCOPY WITH PROPOFOL;  Surgeon: Christena Deem, MD;  Location: [facility] ENDOSCOPY;  Service: Endoscopy;  Laterality: N/A;   COLONOSCOPY WITH PROPOFOL N/A 04/18/2023   Procedure: COLONOSCOPY WITH PROPOFOL;  Surgeon: Regis Bill, MD;  Location: [facility] ENDOSCOPY;  Service: Endoscopy;  Laterality: N/A;   ESOPHAGOGASTRODUODENOSCOPY (EGD) WITH PROPOFOL N/A 10/25/2017   Procedure: ESOPHAGOGASTRODUODENOSCOPY (EGD) WITH PROPOFOL;  Surgeon: Christena Deem, MD;  Location: [facility] ENDOSCOPY;  Service: Endoscopy;  Laterality: N/A;   UPPER GASTROINTESTINAL ENDOSCOPY      Prior to Admission medications   Medication Sig Start Date End Date Taking? Authorizing Provider  amLODipine (NORVASC) 2.5 MG tablet Take 2.5 mg by mouth daily.   Yes [provider]  atorvastatin (LIPITOR) 40 MG tablet Take 40 mg by mouth daily.   Yes [provider]  Butenafine HCl 1 % cream Apply topically daily.   Yes  [provider]  Calcium Carbonate-Vitamin D (CALTRATE 600+D PO) Take by mouth daily.   Yes [provider]  cetirizine (ZYRTEC) 10 MG tablet Take 10 mg by mouth daily.   Yes [provider]  Cholecalciferol 2000 units CAPS Take by mouth daily.   Yes [provider]  cyanocobalamin 2000 MCG tablet Take 2,000 mcg by mouth daily.   Yes [provider]  Dulaglutide (TRULICITY) 1.5 MG/0.5ML SOPN Inject into the skin every 7 (seven) days.   Yes [provider]  ferrous sulfate 324 MG TBEC Take 324 mg by mouth daily with breakfast.   Yes [provider]  fexofenadine (ALLEGRA) 180 MG tablet Take 180 mg by mouth daily.   Yes [provider]  Insulin Glargine (LANTUS SOLOSTAR [place]) Inject 22 Units into the skin daily.   Yes [provider]  lisinopril (PRINIVIL,ZESTRIL) 10 MG tablet Take 10 mg by mouth daily.   Yes [provider]  Melatonin 10 MG TABS Take by mouth at bedtime.   Yes [provider]  metFORMIN (GLUCOPHAGE) 1000 MG tablet Take 1,000 mg by mouth 2 (two) times daily with a meal.   Yes [provider]  Multiple Vitamins-Minerals (PRESERVISION AREDS 2 PO) Take 2 tablets by mouth 2 (two) times daily.   Yes [provider]  Omega-3 Fatty Acids (FISH OIL) 1000 MG CAPS Take by mouth daily.   Yes [provider]  omeprazole (PRILOSEC) 40 MG capsule Take 40 mg by mouth daily.  Yes [provider]    Allergies as of 04/22/2023 - Review Complete 04/18/2023  Allergen Reaction Noted   Formaldehyde  10/24/2017   Latex  10/25/2017   Macrodantin [nitrofurantoin]  10/24/2017    Family History  Problem Relation Age of Onset   Breast cancer Neg Hx     Social History   Socioeconomic History   Marital status: Married    Spouse name: Not on file   Number of children: Not on file   Years of education: Not on file   Highest education level: Not on file   Occupational History   Not on file  Tobacco Use   Smoking status: Never   Smokeless tobacco: Never  Vaping Use   Vaping Use: Never used  Substance and Sexual Activity   Alcohol use: Never   Drug use: Never   Sexual activity: Not on file  Other Topics Concern   Not on file  Social History Narrative   Not on file   Social Determinants of Health   Financial Resource Strain: Not on file  Food Insecurity: Not on file  Transportation Needs: Not on file  Physical Activity: Not on file  Stress: Not on file  Social Connections: Not on file  Intimate Partner Violence: Not on file    Review of Systems: See HPI, otherwise negative ROS  Physical Exam: BP (!) 153/65   Pulse 79   Temp 97.8 F (36.6 C) (Temporal)   Resp 19   Ht 5\' 4"  (1.626 m)   Wt 71.5 kg   SpO2 98%   BMI 27.07 kg/m  General:   Alert, cooperative in NAD Head:  Normocephalic and atraumatic. Respiratory:  Normal work of breathing. Cardiovascular:  RRR  Impression/Plan: Tara Gilbert is here for cataract surgery.  Risks, benefits, limitations, and alternatives regarding cataract surgery have been reviewed with the patient.  Questions have been answered.  All parties agreeable.   Willey Blade, MD  05/16/2023, 7:52 AM

## 2023-05-16 NOTE — Op Note (Signed)
OPERATIVE NOTE  Tara Gilbert [id number] 05/16/2023   PREOPERATIVE DIAGNOSIS:  Nuclear sclerotic cataract left eye.  H25.12   POSTOPERATIVE DIAGNOSIS:    Nuclear sclerotic cataract left eye.     PROCEDURE:  Phacoemusification with posterior chamber intraocular lens placement of the left eye   LENS:   Implant Name Type Inv. Item Serial No. Manufacturer Lot No. LRB No. Used Action  LENS IOL TECNIS EYHANCE 21.5 - [id number] Intraocular Lens LENS IOL TECNIS EYHANCE 21.5 [id number] [employer]  Left 1 Implanted      Procedure(s) with comments: CATARACT EXTRACTION PHACO AND INTRAOCULAR LENS PLACEMENT (IOC) LEFT DIABETIC 3.67 00:36.2 (Left) - Latex Diabetic  DIB00 +21.5   ULTRASOUND TIME: 0 minutes 36 seconds.  CDE 3.67   SURGEON:  Willey Blade, MD, MPH   ANESTHESIA:  Topical with tetracaine drops augmented with 1% preservative-free intracameral lidocaine.  ESTIMATED BLOOD LOSS: <1 mL   COMPLICATIONS:  None.   DESCRIPTION OF PROCEDURE:  The patient was identified in the holding room and transported to the operating room and placed in the supine position under the operating microscope.  The left eye was identified as the operative eye and it was prepped and draped in the usual sterile ophthalmic fashion.   A 1.0 millimeter clear-corneal paracentesis was made at the 5:00 position. 0.5 ml of preservative-free 1% lidocaine with epinephrine was injected into the anterior chamber.  The anterior chamber was filled with viscoelastic.  A 2.4 millimeter keratome was used to make a near-clear corneal incision at the 2:00 position.  A curvilinear capsulorrhexis was made with a cystotome and capsulorrhexis forceps.  Balanced salt solution was used to hydrodissect and hydrodelineate the nucleus.   Phacoemulsification was then used in stop and chop fashion to remove the lens nucleus and epinucleus.  The remaining cortex was then removed using the irrigation and aspiration handpiece. Viscoelastic was  then placed into the capsular bag to distend it for lens placement.  A lens was then injected into the capsular bag.  The remaining viscoelastic was aspirated.   Wounds were hydrated with balanced salt solution.  The anterior chamber was inflated to a physiologic pressure with balanced salt solution.  Intracameral vigamox 0.1 mL undiltued was injected into the eye and a drop placed onto the ocular surface.  No wound leaks were noted.  The patient was taken to the recovery room in stable condition without complications of anesthesia or surgery  Willey Blade 05/16/2023, 8:19 AM

## 2023-05-16 NOTE — Anesthesia Preprocedure Evaluation (Signed)
Anesthesia Evaluation  Patient identified by MRN, date of birth, ID band Patient awake    Reviewed: Allergy & Precautions, H&P , NPO status , Patient's Chart, lab work & pertinent test results  Airway Mallampati: I  TM Distance: >3 FB Neck ROM: Full    Dental no notable dental hx.    Pulmonary neg pulmonary ROS, sleep apnea    Pulmonary exam normal breath sounds clear to auscultation       Cardiovascular hypertension, negative cardio ROS Normal cardiovascular exam Rhythm:Regular Rate:Normal     Neuro/Psych negative neurological ROS  negative psych ROS   GI/Hepatic negative GI ROS, Neg liver ROS,GERD  ,,  Endo/Other  negative endocrine ROSdiabetes    Renal/GU negative Renal ROS  negative genitourinary   Musculoskeletal negative musculoskeletal ROS (+)    Abdominal   Peds negative pediatric ROS (+)  Hematology negative hematology ROS (+)   Anesthesia Other Findings   Reproductive/Obstetrics negative OB ROS                             Anesthesia Physical Anesthesia Plan  ASA: 3  Anesthesia Plan: MAC   Post-op Pain Management:    Induction: Intravenous  PONV Risk Score and Plan:   Airway Management Planned: Natural Airway and Nasal Cannula  Additional Equipment:   Intra-op Plan:   Post-operative Plan:   Informed Consent: I have reviewed the patients History and Physical, chart, labs and discussed the procedure including the risks, benefits and alternatives for the proposed anesthesia with the patient or authorized representative who has indicated his/her understanding and acceptance.     Dental Advisory Given  Plan Discussed with: Anesthesiologist, CRNA and Surgeon  Anesthesia Plan Comments: (Patient consented for risks of anesthesia including but not limited to:  - adverse reactions to medications - damage to eyes, teeth, lips or other oral mucosa - nerve damage due to  positioning  - sore throat or hoarseness - Damage to heart, brain, nerves, lungs, other parts of body or loss of life  Patient voiced understanding.)       Anesthesia Quick Evaluation

## 2023-05-16 NOTE — Transfer of Care (Signed)
Immediate Anesthesia Transfer of Care Note  Patient: Tara Gilbert  Procedure(s) Performed: CATARACT EXTRACTION PHACO AND INTRAOCULAR LENS PLACEMENT (IOC) LEFT DIABETIC 3.67 00:36.2 (Left)  Patient Location: PACU  Anesthesia Type: MAC  Level of Consciousness: awake, alert  and patient cooperative  Airway and Oxygen Therapy: Patient Spontanous Breathing and Patient connected to supplemental oxygen  Post-op Assessment: Post-op Vital signs reviewed, Patient's Cardiovascular Status Stable, Respiratory Function Stable, Patent Airway and No signs of Nausea or vomiting  Post-op Vital Signs: Reviewed and stable  Complications: No notable events documented.

## 2023-05-17 ENCOUNTER — Encounter: Payer: Self-pay | Admitting: Ophthalmology

## 2023-09-09 ENCOUNTER — Other Ambulatory Visit: Payer: Self-pay | Admitting: Internal Medicine

## 2023-09-09 DIAGNOSIS — Z1231 Encounter for screening mammogram for malignant neoplasm of breast: Secondary | ICD-10-CM

## 2023-10-06 ENCOUNTER — Ambulatory Visit
Admission: RE | Admit: 2023-10-06 | Discharge: 2023-10-06 | Disposition: A | Discharge status: Home / Self Care | Payer: Medicare PPO | Source: Ambulatory Visit | Attending: Internal Medicine | Admitting: Internal Medicine

## 2023-10-06 DIAGNOSIS — Z1231 Encounter for screening mammogram for malignant neoplasm of breast: Secondary | ICD-10-CM | POA: Insufficient documentation

## 2023-10-11 ENCOUNTER — Other Ambulatory Visit: Payer: Self-pay | Admitting: Internal Medicine

## 2023-10-11 DIAGNOSIS — N63 Unspecified lump in unspecified breast: Secondary | ICD-10-CM

## 2023-10-21 ENCOUNTER — Ambulatory Visit
Admission: RE | Admit: 2023-10-21 | Discharge: 2023-10-21 | Disposition: A | Discharge status: Home / Self Care | Payer: Medicare PPO | Source: Ambulatory Visit | Attending: Internal Medicine | Admitting: Internal Medicine

## 2023-10-21 DIAGNOSIS — N63 Unspecified lump in unspecified breast: Secondary | ICD-10-CM

## 2023-10-21 DIAGNOSIS — N6321 Unspecified lump in the left breast, upper outer quadrant: Secondary | ICD-10-CM | POA: Insufficient documentation

## 2024-10-11 ENCOUNTER — Other Ambulatory Visit: Payer: Self-pay | Admitting: Internal Medicine

## 2024-10-11 DIAGNOSIS — Z1231 Encounter for screening mammogram for malignant neoplasm of breast: Secondary | ICD-10-CM

## 2024-11-13 ENCOUNTER — Ambulatory Visit
Admission: RE | Admit: 2024-11-13 | Discharge: 2024-11-13 | Disposition: A | Discharge status: Home / Self Care | Source: Ambulatory Visit | Attending: Internal Medicine | Admitting: Internal Medicine

## 2024-11-13 DIAGNOSIS — Z1231 Encounter for screening mammogram for malignant neoplasm of breast: Secondary | ICD-10-CM | POA: Diagnosis present
